# Patient Record
Sex: Female | Born: 1995 | Hispanic: Yes | Marital: Married | Smoking: Never smoker
Health system: Southern US, Community
[De-identification: ages and names within clinical notes are randomized; demographics above are authoritative.]

## PROBLEM LIST (undated history)

## (undated) ENCOUNTER — Inpatient Hospital Stay (HOSPITAL_COMMUNITY): Payer: Self-pay

## (undated) DIAGNOSIS — Z789 Other specified health status: Secondary | ICD-10-CM

## (undated) HISTORY — PX: NO PAST SURGERIES: SHX2092

---

## 2016-08-12 ENCOUNTER — Other Ambulatory Visit (HOSPITAL_COMMUNITY): Payer: Self-pay | Admitting: Nurse Practitioner

## 2016-08-12 DIAGNOSIS — Z369 Encounter for antenatal screening, unspecified: Secondary | ICD-10-CM

## 2016-08-12 LAB — OB RESULTS CONSOLE ANTIBODY SCREEN: Antibody Screen: NEGATIVE

## 2016-08-12 LAB — OB RESULTS CONSOLE RUBELLA ANTIBODY, IGM: Rubella: IMMUNE

## 2016-08-12 LAB — OB RESULTS CONSOLE ABO/RH: RH TYPE: POSITIVE

## 2016-08-12 LAB — OB RESULTS CONSOLE HEPATITIS B SURFACE ANTIGEN: Hepatitis B Surface Ag: NEGATIVE

## 2016-08-12 LAB — OB RESULTS CONSOLE HIV ANTIBODY (ROUTINE TESTING): HIV: NONREACTIVE

## 2016-08-12 LAB — OB RESULTS CONSOLE RPR: RPR: NONREACTIVE

## 2016-08-15 ENCOUNTER — Encounter (HOSPITAL_COMMUNITY): Payer: Self-pay | Admitting: *Deleted

## 2016-08-16 ENCOUNTER — Ambulatory Visit (HOSPITAL_COMMUNITY)
Admission: RE | Admit: 2016-08-16 | Discharge: 2016-08-16 | Disposition: A | Discharge status: Home / Self Care | Payer: Self-pay | Source: Ambulatory Visit | Attending: Nurse Practitioner | Admitting: Nurse Practitioner

## 2016-08-16 ENCOUNTER — Encounter (HOSPITAL_COMMUNITY): Payer: Self-pay

## 2016-08-16 ENCOUNTER — Other Ambulatory Visit (HOSPITAL_COMMUNITY): Payer: Self-pay | Admitting: Nurse Practitioner

## 2016-08-16 DIAGNOSIS — Z3682 Encounter for antenatal screening for nuchal translucency: Secondary | ICD-10-CM

## 2016-08-16 DIAGNOSIS — Z369 Encounter for antenatal screening, unspecified: Secondary | ICD-10-CM

## 2016-08-16 DIAGNOSIS — Z3A12 12 weeks gestation of pregnancy: Secondary | ICD-10-CM | POA: Insufficient documentation

## 2016-08-16 HISTORY — DX: Other specified health status: Z78.9

## 2016-08-23 ENCOUNTER — Other Ambulatory Visit (HOSPITAL_COMMUNITY): Payer: Self-pay

## 2016-09-09 NOTE — L&D Delivery Note (Addendum)
Patient is a 20y/o G1P1 @ 40 and 1 who presente din spontaneous labor. She progressed to complete and began pushing. She pushed for 2 hours with good progress. Pitocin was started to augment and pushined for another hour with little progress. Risk of vaccuum where discussed with patient and she agreed to proceed.   Operative Delivery Note At 12:40 PM a viable female was delivered via Vaginal, Vacuum Investment banker, operational(Extractor).  Presentation: vertex; Position: Occiput,, Anterior; Station: +2.  Verbal consent: obtained from patient. Offered interpreter services and patient declined. Risks and benefits discussed in detail.  Risks include, but are not limited to the risks of anesthesia, bleeding, infection, damage to maternal tissues, fetal cephalhematoma.  There is also the risk of inability to effect vaginal delivery of the head, or shoulder dystocia that cannot be resolved by established maneuvers, leading to the need for emergency cesarean section.  APGAR: 9, 9; weight pending  .   Placenta status:delivered intact with gentle tractions , .   Cord: 3 vessel with the following complications:nuchal x1 .  Cord pH: not sent  Anesthesia:  epidural Instruments: mighty vac with bell Episiotomy: None Lacerations: 3rd degree with >50% of sphincter involved. Suture Repair: rectal sheath was repaired with 2-0 moncryl with figure of eights. The remaineder of the vaginal repair done with 3-0 vicryl. 4-0 monocryl used to close the skin. Est. Blood Loss (mL):  300  Mom to postpartum.  Baby to Couplet care / Skin to Skin.  Ernestina Pennaicholas Schenk 02/25/2017, 1:27 PM

## 2016-11-07 ENCOUNTER — Inpatient Hospital Stay (HOSPITAL_COMMUNITY)
Admission: AD | Admit: 2016-11-07 | Discharge: 2016-11-07 | Disposition: A | Discharge status: Home / Self Care | Payer: Self-pay | Source: Ambulatory Visit | Attending: Obstetrics & Gynecology | Admitting: Obstetrics & Gynecology

## 2016-11-07 ENCOUNTER — Encounter (HOSPITAL_COMMUNITY): Payer: Self-pay | Admitting: *Deleted

## 2016-11-07 DIAGNOSIS — O98812 Other maternal infectious and parasitic diseases complicating pregnancy, second trimester: Secondary | ICD-10-CM | POA: Insufficient documentation

## 2016-11-07 DIAGNOSIS — B379 Candidiasis, unspecified: Secondary | ICD-10-CM

## 2016-11-07 DIAGNOSIS — O26892 Other specified pregnancy related conditions, second trimester: Secondary | ICD-10-CM | POA: Insufficient documentation

## 2016-11-07 DIAGNOSIS — N949 Unspecified condition associated with female genital organs and menstrual cycle: Secondary | ICD-10-CM

## 2016-11-07 DIAGNOSIS — B373 Candidiasis of vulva and vagina: Secondary | ICD-10-CM | POA: Insufficient documentation

## 2016-11-07 DIAGNOSIS — Z3A24 24 weeks gestation of pregnancy: Secondary | ICD-10-CM | POA: Insufficient documentation

## 2016-11-07 LAB — URINALYSIS, ROUTINE W REFLEX MICROSCOPIC
BILIRUBIN URINE: NEGATIVE
Glucose, UA: NEGATIVE mg/dL
HGB URINE DIPSTICK: NEGATIVE
KETONES UR: NEGATIVE mg/dL
NITRITE: NEGATIVE
Protein, ur: NEGATIVE mg/dL
SPECIFIC GRAVITY, URINE: 1.003 — AB (ref 1.005–1.030)
pH: 7 (ref 5.0–8.0)

## 2016-11-07 LAB — WET PREP, GENITAL
CLUE CELLS WET PREP: NONE SEEN
SPERM: NONE SEEN
Trich, Wet Prep: NONE SEEN

## 2016-11-07 MED ORDER — COMFORT FIT MATERNITY SUPP SM MISC: 1.0000 [IU] | Freq: Every day | 0 refills | Status: DC | PRN

## 2016-11-07 MED ORDER — TERCONAZOLE 0.4 % VA CREA: 1.0000 | TOPICAL_CREAM | Freq: Every day | VAGINAL | 0 refills | Status: DC

## 2016-11-07 NOTE — MAU Note (Addendum)
WITH INTERPRETER-    VIREA   PT SAYS   SHE  HAS LOWER ABD PAIN    AND  BACK PAIN  THAT COME/  GOES- STARTED    TODAY  AT  3 PM.    NO MEDS FOR PAIN .   [facility]- WITH [facility]-   DID NOT  CALL THEM.    NO DIFFICULTY VOIDING,     REG D/C.      LAST SEX-    YESTERDAY           SAYS     THIS  SAME  PAIN HAPPENED  1 WEEK AGO   BUT  WENT  AWAY

## 2016-11-07 NOTE — Discharge Instructions (Signed)
Candidiasis vaginal en los adultos (Gastrointestinal Yeast Infection, Adult) La candidiasis vaginal es una afeccin que causa dolor, hinchazn y enrojecimiento (inflamacin) de la vagina. Tambin causa secrecin vaginal. Esta es una enfermedad frecuente. Algunas mujeres contraen esta infeccin con frecuencia. CAUSAS La causa de la infeccin es un cambio en el equilibrio normal de los hongos (cndida) y las bacterias que viven en la vagina. Esta alteracin deriva en el crecimiento excesivo de los hongos, lo que causa la inflamacin. FACTORES DE RIESGO Es ms probable que esta afeccin se manifieste en:  Las mujeres que toman antibiticos.  Las mujeres que tienen diabetes.  Las mujeres que toman anticonceptivos.  Las mujeres que estn embarazadas.  Las mujeres que se hacen duchas vaginales con frecuencia.  Las mujeres que tienen un sistema de defensa (inmunitario) dbil.  Las mujeres que han tomado corticoides durante mucho tiempo.  Las mujeres que usan ropa ajustada con frecuencia. SNTOMAS Los sntomas de esta afeccin incluyen lo siguiente:  Secrecin vaginal blanca y espesa.  Hinchazn, picazn, enrojecimiento e irritacin de la vagina. Los labios de la vagina (vulva) tambin se pueden infectar.  Dolor o ardor al Geographical information systems officer.  Dolor durante las [employer]. DIAGNSTICO Esta afeccin se diagnostica mediante la historia clnica y un examen fsico. Este incluye un examen plvico. El mdico examinar una muestra de la secrecin vaginal con un microscopio. Probablemente el mdico enve esta muestra al laboratorio para analizarla y confirmar el diagnstico. TRATAMIENTO Esta afeccin se trata con medicamentos. Los [employer] pueden ser recetados o de [address]. Podrn indicarle que use uno o ms de lo siguiente:  Medicamentos por va oral.  Medicamentos que se aplican como una crema.  Medicamentos que se colocan directamente en la vagina (vulos vaginales). INSTRUCCIONES  PARA EL CUIDADO EN EL HOGAR  Tome o aplquese los medicamentos de venta libre y Building control surveyor como se lo haya indicado el mdico.  No tenga relaciones sexuales hasta que el mdico lo autorice. Comunique a su compaero sexual que tiene una infeccin por hongos. Esas personas deben consultar al mdico si tienen sntomas.  No use ropa ajustada, como pantis o pantalones ajustados.  Evite el uso de tampones hasta que el mdico lo autorice.  Consuma ms yogur. Esto puede ayudar a Artist de la candidiasis.  Intente darse un bao de asiento para [employer]. Se trata de un bao de agua tibia que se toma mientras se est sentado. El agua solo debe Adult nurse las caderas y cubrir las nalgas. Hgalo 3o 4veces al da o como se lo haya indicado el mdico.  No se haga duchas vaginales.  Use ropa interior transpirable de algodn.  Si tiene diabetes, mantenga bajo control los niveles de Banker. SOLICITE ATENCIN MDICA SI:  Lance Muss.  Los sntomas desaparecen y Stage manager.  Los sntomas no mejoran con Scientist, research (medical).  Los sntomas empeoran.  Aparecen nuevos sntomas.  Aparecen ampollas alrededor o adentro de la vagina.  Le sale sangre de la vagina y no est menstruando.  Siente dolor en el abdomen. Esta informacin no tiene Theme park manager el consejo del mdico. Asegrese de hacerle al mdico cualquier pregunta que tenga. Document Released: 06/05/2005 Document Revised: 12/18/2015 Document Reviewed: 02/27/2015 Elsevier Interactive Patient Education  2017 Elsevier Inc.  Round Ligament Pain during Pregnancy Many women will experience a type of pain referred to as round ligament pain during their pregnancy. This is associated with abdominal pain or discomfort. Since any type of abdominal pain during  pregnancy can be disconcerting, it is important to talk about round ligament pain to relieve any anxiety or fears you may have  regarding the symptoms you are feeling. Round ligament pain is due to normal changes that take place in the body during pregnancy. It is caused by stretching of the round ligaments attached to the uterus. More commonly it occurs on the right side of the pelvis. Round Ligament: An Overview Typically in the non-pregnant state the uterus is about the size of an apple or pear. There are thick ligaments which hold the uterus in place in the abdomen, referred to as round ligaments. During pregnancy, your uterus will expand in size and weight, and the ligaments supporting it will have to stretch, becoming longer and thinner. As these ligaments pull and tug they may irritate nearby nerve fibers, which causes pain. The severity of the pain in some cases can seem extreme. Some common symptoms of round ligament pain include:  Ligament spasms or contractions/cramps that trigger a sharp pain typically on the right side of the abdomen.  Pain upon waking or suddenly rolling over in your sleep.  Pain in the abdomen that is sharp brought on by exercise or other vigorous activity. Similar Problems Round ligament pain is often mistaken for other medical conditions because the symptoms are similar. Acute abdominal pain during pregnancy may also be a sign of other conditions including:  Abdominal cramps - Some abdominal pain is simply caused by change in bowel habits associated with pregnancy. Gas is a common problem that can cause sharp, shooting pain. You should always seek out medical care if your pain is accompanied by fever, chills, pain upon urination or if you have difficulty walking. Further exams and tests will be conducted to ensure that you do not have a more serious condition. It is not uncommon for women with lower abdominal pain to have a urinary tract infection, thus you may also be asked for a urine sample. Treatment If all other conditions are ruled out you can treat your round ligament  pain relatively easily. You may be advised to take some acetaminophen (Tylenol) to reduce the severity of any persistent pain and asked to reduce your activity level. You can apply a heating pad to the area of pain or take a warm bath. Lying on the opposite side of the pain may help as well. Most women will find relief from round ligament pain simply by altering their daily routines slightly. The good news is round ligament pain will disappear completely once you have given birth to your child!

## 2016-11-07 NOTE — MAU Provider Note (Signed)
History     CSN: [id number]  Arrival date and time: 11/07/16 1754   First Provider Initiated Contact with Patient 11/07/16 2015      Chief Complaint  Patient presents with  . Abdominal Pain   Crystal Huff is a 21 y.o. G1P0 at [id number] who presents today with abdominal pain. She states that off and on for about one week she has had bilateral lower abdominal pain that wraps to her back. She states that the pain is worse with some movement. She denies any VB or LOF. She reports regular fetal movement. She denies any vaginal discharge.    Abdominal Pain  This is a new problem. The current episode started yesterday. The onset quality is gradual. The problem occurs constantly. The problem has been unchanged. Pain location: along the sides of the abdomen and radiating to the back.  The pain is at a severity of 6/10. The quality of the pain is sharp. The abdominal pain radiates to the back. Pertinent negatives include no constipation, diarrhea, dysuria, fever, nausea or vomiting. Nothing aggravates the pain. The pain is relieved by nothing. She has tried nothing for the symptoms.   Past Medical History:  Diagnosis Date  . Medical history non-contributory     Past Surgical History:  Procedure Laterality Date  . NO PAST SURGERIES      History reviewed. No pertinent family history.  Social History  Substance Use Topics  . Smoking status: Never Smoker  . Smokeless tobacco: Never Used  . Alcohol use No    Allergies: No Known Allergies  Prescriptions Prior to Admission  Medication Sig Dispense Refill Last Dose  . Prenatal Vit-Fe Fumarate-FA (PRENATAL VITAMIN PO) Take by mouth.   Taking    Review of Systems  Constitutional: Negative for chills and fever.  Gastrointestinal: Positive for abdominal pain. Negative for constipation, diarrhea, nausea and vomiting.  Genitourinary: Negative for dysuria, urgency, vaginal bleeding and vaginal discharge.   Physical Exam   Blood  pressure 102/57, pulse 70, temperature 98.5 F (36.9 C), temperature source Oral, resp. rate 18, height 5\' 1"  (1.549 m), weight 150 lb 12 oz (68.4 kg), last menstrual period 05/20/2016.  Physical Exam  Nursing note and vitals reviewed. Constitutional: She is oriented to person, place, and time. She appears well-developed and well-nourished. No distress.  HENT:  Head: Normocephalic.  Cardiovascular: Normal rate.   Respiratory: Effort normal.  GI: Soft. There is no tenderness. There is no rebound.  Genitourinary:  Genitourinary Comments:  Cervix: closed/thick/ballotable.   Neurological: She is alert and oriented to person, place, and time.  Skin: Skin is warm and dry.  Psychiatric: She has a normal mood and affect.   Results for orders placed or performed during the hospital encounter of 11/07/16 (from the past 24 hour(s))  Urinalysis, Routine w reflex microscopic     Status: Abnormal   Collection Time: 11/07/16  6:34 PM  Result Value Ref Range   Color, Urine STRAW (A) YELLOW   APPearance CLEAR CLEAR   Specific Gravity, Urine 1.003 (L) 1.005 - 1.030   pH 7.0 5.0 - 8.0   Glucose, UA NEGATIVE NEGATIVE mg/dL   Hgb urine dipstick NEGATIVE NEGATIVE   Bilirubin Urine NEGATIVE NEGATIVE   Ketones, ur NEGATIVE NEGATIVE mg/dL   Protein, ur NEGATIVE NEGATIVE mg/dL   Nitrite NEGATIVE NEGATIVE   Leukocytes, UA MODERATE (A) NEGATIVE   RBC / HPF 0-5 0 - 5 RBC/hpf   WBC, UA 0-5 0 - 5 WBC/hpf   Bacteria,  UA MANY (A) NONE SEEN   Squamous Epithelial / LPF 0-5 (A) NONE SEEN  Wet prep, genital     Status: Abnormal   Collection Time: 11/07/16  8:20 PM  Result Value Ref Range   Yeast Wet Prep HPF POC PRESENT (A) NONE SEEN   Trich, Wet Prep NONE SEEN NONE SEEN   Clue Cells Wet Prep HPF POC NONE SEEN NONE SEEN   WBC, Wet Prep HPF POC FEW (A) NONE SEEN   Sperm NONE SEEN    FHT: 135, moderate with 10x10 accels, random variable, appropriate for GA Toco: no UCs  MAU Course   Procedures  MDM   Assessment and Plan   1. Round ligament pain   2. [redacted] weeks gestation of pregnancy   3. Yeast infection    DC home Comfort measures reviewed  2nd/3rd Trimester precautions  PTL precautions  Fetal kick counts RX: terazol 7 as directed, maternity support belt  Return to MAU as needed FU with OB as planned  Follow-up Information    [facility] Follow up.   Contact information: [address] [place] [address] [phone]            Tawnya Crook 11/07/2016, 8:18 PM

## 2016-11-08 LAB — GC/CHLAMYDIA PROBE AMP (~~LOC~~) NOT AT ARMC
CHLAMYDIA, DNA PROBE: NEGATIVE
NEISSERIA GONORRHEA: NEGATIVE

## 2016-12-16 ENCOUNTER — Ambulatory Visit
Admission: RE | Admit: 2016-12-16 | Discharge: 2016-12-16 | Disposition: A | Discharge status: Home / Self Care | Payer: No Typology Code available for payment source | Source: Ambulatory Visit | Attending: Infectious Disease | Admitting: Infectious Disease

## 2016-12-16 ENCOUNTER — Other Ambulatory Visit: Payer: Self-pay | Admitting: Infectious Disease

## 2016-12-16 DIAGNOSIS — R7611 Nonspecific reaction to tuberculin skin test without active tuberculosis: Secondary | ICD-10-CM

## 2017-01-27 LAB — OB RESULTS CONSOLE GC/CHLAMYDIA
Chlamydia: NEGATIVE
Gonorrhea: NEGATIVE

## 2017-01-27 LAB — OB RESULTS CONSOLE GBS: STREP GROUP B AG: NEGATIVE

## 2017-02-24 ENCOUNTER — Encounter (HOSPITAL_COMMUNITY): Payer: Self-pay

## 2017-02-24 ENCOUNTER — Inpatient Hospital Stay (HOSPITAL_COMMUNITY)
Admission: AD | Admit: 2017-02-24 | Discharge: 2017-02-27 | DRG: 775 | Disposition: A | Discharge status: Home / Self Care | Payer: Medicaid Other | Source: Ambulatory Visit | Attending: Family Medicine | Admitting: Family Medicine

## 2017-02-24 DIAGNOSIS — Z3493 Encounter for supervision of normal pregnancy, unspecified, third trimester: Secondary | ICD-10-CM | POA: Diagnosis present

## 2017-02-24 DIAGNOSIS — Z91040 Latex allergy status: Secondary | ICD-10-CM | POA: Diagnosis not present

## 2017-02-24 DIAGNOSIS — Z3A40 40 weeks gestation of pregnancy: Secondary | ICD-10-CM | POA: Diagnosis not present

## 2017-02-24 LAB — CBC
HCT: 37.7 % (ref 36.0–46.0)
HEMOGLOBIN: 12.2 g/dL (ref 12.0–15.0)
MCH: 29.8 pg (ref 26.0–34.0)
MCHC: 32.4 g/dL (ref 30.0–36.0)
MCV: 92.2 fL (ref 78.0–100.0)
Platelets: 211 10*3/uL (ref 150–400)
RBC: 4.09 MIL/uL (ref 3.87–5.11)
RDW: 15.1 % (ref 11.5–15.5)
WBC: 13.3 10*3/uL — AB (ref 4.0–10.5)

## 2017-02-24 LAB — TYPE AND SCREEN
ABO/RH(D): A POS
Antibody Screen: NEGATIVE

## 2017-02-24 MED ORDER — LACTATED RINGERS IV SOLN
INTRAVENOUS | Status: DC
  Administered 2017-02-24 – 2017-02-25 (×2): via INTRAVENOUS

## 2017-02-24 MED ORDER — LACTATED RINGERS IV SOLN
500.0000 mL | INTRAVENOUS | Status: DC | PRN
  Administered 2017-02-25: 500 mL via INTRAVENOUS
  Administered 2017-02-25 (×2): 250 mL via INTRAVENOUS

## 2017-02-24 MED ORDER — ONDANSETRON HCL 4 MG/2ML IJ SOLN: 4.0000 mg | Freq: Four times a day (QID) | INTRAMUSCULAR | Status: DC | PRN

## 2017-02-24 MED ORDER — LIDOCAINE HCL (PF) 1 % IJ SOLN
30.0000 mL | INTRAMUSCULAR | Status: DC | PRN
  Filled 2017-02-24: qty 30

## 2017-02-24 MED ORDER — OXYTOCIN 40 UNITS IN LACTATED RINGERS INFUSION - SIMPLE MED
2.5000 [IU]/h | INTRAVENOUS | Status: DC
  Filled 2017-02-24: qty 1000

## 2017-02-24 MED ORDER — FLEET ENEMA 7-19 GM/118ML RE ENEM: 1.0000 | ENEMA | RECTAL | Status: DC | PRN

## 2017-02-24 MED ORDER — OXYCODONE-ACETAMINOPHEN 5-325 MG PO TABS: 2.0000 | ORAL_TABLET | ORAL | Status: DC | PRN

## 2017-02-24 MED ORDER — ACETAMINOPHEN 325 MG PO TABS
650.0000 mg | ORAL_TABLET | ORAL | Status: DC | PRN
  Filled 2017-02-24: qty 2

## 2017-02-24 MED ORDER — SOD CITRATE-CITRIC ACID 500-334 MG/5ML PO SOLN
30.0000 mL | ORAL | Status: DC | PRN
  Filled 2017-02-24: qty 15

## 2017-02-24 MED ORDER — OXYTOCIN BOLUS FROM INFUSION
500.0000 mL | Freq: Once | INTRAVENOUS | Status: AC
  Administered 2017-02-25: 500 mL via INTRAVENOUS

## 2017-02-24 MED ORDER — FENTANYL CITRATE (PF) 100 MCG/2ML IJ SOLN
100.0000 ug | INTRAMUSCULAR | Status: DC | PRN
Start: 2017-02-24 — End: 2017-02-27

## 2017-02-24 MED ORDER — OXYCODONE-ACETAMINOPHEN 5-325 MG PO TABS: 1.0000 | ORAL_TABLET | ORAL | Status: DC | PRN

## 2017-02-24 NOTE — H&P (Signed)
LABOR AND DELIVERY ADMISSION HISTORY AND PHYSICAL NOTE  Latangela Mccomas is a 21 y.o. female G1P0 with IUP at [redacted]w[redacted]d presenting for SOL.   She reports positive fetal movement. She denies leakage of fluid or vaginal bleeding.  Prenatal History/Complications:  NONE  Past Medical History: Past Medical History:  Diagnosis Date  . Medical history non-contributory     Past Surgical History: Past Surgical History:  Procedure Laterality Date  . NO PAST SURGERIES      Obstetrical History: OB History    Gravida Para Term Preterm AB Living   1             SAB TAB Ectopic Multiple Live Births                  Social History: Social History   Social History  . Marital status: Married    Spouse name: N/A  . Number of children: N/A  . Years of education: N/A   Social History Main Topics  . Smoking status: Never Smoker  . Smokeless tobacco: Never Used  . Alcohol use No  . Drug use: No  . Sexual activity: Yes   Other Topics Concern  . None   Social History Narrative  . None    Family History: History reviewed. No pertinent family history.  Allergies: Allergies  Allergen Reactions  . Latex Other (See Comments)    Causes irritation.    Prescriptions Prior to Admission  Medication Sig Dispense Refill Last Dose  . Elastic Bandages & Supports (COMFORT FIT MATERNITY SUPP SM) MISC 1 Units by Does not apply route daily as needed. 1 each 0   . Prenatal Vit-Fe Fumarate-FA (PRENATAL MULTIVITAMIN) TABS tablet Take 1 tablet by mouth daily at 12 noon.   11/07/2016 at Unknown time  . terconazole (TERAZOL 7) 0.4 % vaginal cream Place 1 applicator vaginally at bedtime. For 7 nights 45 g 0      Review of Systems   All systems reviewed and negative except as stated in HPI  Physical Exam Blood pressure 117/73, pulse 78, temperature 98.1 F (36.7 C), temperature source Oral, resp. rate 18, height 5\' 2"  (1.575 m), weight 203 lb (92.1 kg), last menstrual period 05/20/2016,  SpO2 100 %. General appearance: alert, cooperative, appears stated age and no distress Lungs: clear to auscultation bilaterally Heart: regular rate and rhythm Abdomen: soft, non-tender; bowel sounds normal Extremities: No calf swelling or tenderness Presentation: cephalic Fetal monitoring: 135 bpm, mod var, +accels, no decels Uterine activity: q4-78min Dilation: 4.5 Effacement (%): 90 Station: -2 Exam by:: Risk analyst, RN   Prenatal labs: ABO, Rh:   A POS Antibody:  NEG Rubella: !Error! IMMUNE RPR:    NON-REACTIVE HBsAg:    NON-REACTIVE HIV:   NON-REACTIVE GBS:    NEG 1 hr Glucola: 75 - normal Genetic screening:  declined Anatomy US: Normal  Prenatal Transfer Tool  Maternal Diabetes: No Genetic Screening: Declined Maternal Ultrasounds/Referrals: Normal Fetal Ultrasounds or other Referrals:  None Maternal Substance Abuse:  No Significant Maternal Medications:  None Significant Maternal Lab Results: Lab values include: Group B Strep negative, Other: +PPD  No results found for this or any previous visit (from the past 24 hour(s)).  There are no active problems to display for this patient.   Assessment: Karalina Tift is a 21 y.o. G1P0 at [redacted]w[redacted]d here for SOL  #Labor:SOL, augment as needed #Pain: IV pain meds for now, considering epidural #FWB: Cat I #ID:  GBS Neg;  +PPD early in  pregnancy (CXR was normal, follow up with health department postpartum) #MOF: Breast #MOC:Depo #Circ:  Declines  Jen MowElizabeth Mumaw, DO OB Fellow [facility] 02/24/2017, 8:35 PM

## 2017-02-24 NOTE — MAU Note (Signed)
Urine in lab 

## 2017-02-24 NOTE — MAU Note (Signed)
Pt. Removed from fetal monitors - per Dr. Omer JackMumaw - OK for patient to ambulate hallway for one hour, recheck SVE and call with results - to determine admission or discharge.  Pt. Has family members at the bedside to translate - pt. Permission received.

## 2017-02-24 NOTE — MAU Note (Signed)
Pt reports contractions since 8:30 am, now 5 minutes apart.

## 2017-02-24 NOTE — Progress Notes (Signed)
FHR reactive.  EFM discontinued for pt to ambulate x1hr.  Pt instructed to return to room if LOF, gush of fluid, sudden sharp pain, or vaginal bleeding.  Pt & family verb understanding.

## 2017-02-24 NOTE — Progress Notes (Signed)
Report given to Dr Omer JackMumaw via phone re: pt arrival, reactive FHR, UCs, & change in SVE after ambulating. Orders rec'd to allow pt to ambulate more & recheck cervix.

## 2017-02-25 ENCOUNTER — Encounter (HOSPITAL_COMMUNITY): Payer: Self-pay | Admitting: Anesthesiology

## 2017-02-25 ENCOUNTER — Inpatient Hospital Stay (HOSPITAL_COMMUNITY): Payer: Medicaid Other | Admitting: Anesthesiology

## 2017-02-25 DIAGNOSIS — Z3A40 40 weeks gestation of pregnancy: Secondary | ICD-10-CM

## 2017-02-25 LAB — ABO/RH: ABO/RH(D): A POS

## 2017-02-25 LAB — RPR: RPR: NONREACTIVE

## 2017-02-25 MED ORDER — PHENYLEPHRINE 40 MCG/ML (10ML) SYRINGE FOR IV PUSH (FOR BLOOD PRESSURE SUPPORT)
80.0000 ug | PREFILLED_SYRINGE | INTRAVENOUS | Status: DC | PRN
  Filled 2017-02-25: qty 5

## 2017-02-25 MED ORDER — PRENATAL MULTIVITAMIN CH
1.0000 | ORAL_TABLET | Freq: Every day | ORAL | Status: DC
  Administered 2017-02-26: 1 via ORAL
  Filled 2017-02-25: qty 1

## 2017-02-25 MED ORDER — ACETAMINOPHEN 325 MG PO TABS
650.0000 mg | ORAL_TABLET | ORAL | Status: DC | PRN
  Administered 2017-02-25 – 2017-02-26 (×3): 650 mg via ORAL
  Filled 2017-02-25 (×2): qty 2

## 2017-02-25 MED ORDER — PHENYLEPHRINE 40 MCG/ML (10ML) SYRINGE FOR IV PUSH (FOR BLOOD PRESSURE SUPPORT)
80.0000 ug | PREFILLED_SYRINGE | INTRAVENOUS | Status: DC | PRN
  Filled 2017-02-25: qty 10
  Filled 2017-02-25: qty 5

## 2017-02-25 MED ORDER — TETANUS-DIPHTH-ACELL PERTUSSIS 5-2.5-18.5 LF-MCG/0.5 IM SUSP
0.5000 mL | Freq: Once | INTRAMUSCULAR | Status: DC
  Filled 2017-02-25: qty 0.5

## 2017-02-25 MED ORDER — FENTANYL 2.5 MCG/ML BUPIVACAINE 1/10 % EPIDURAL INFUSION (WH - ANES)
14.0000 mL/h | INTRAMUSCULAR | Status: DC | PRN
  Administered 2017-02-25: 14 mL/h via EPIDURAL
  Administered 2017-02-25: 12 mL/h via EPIDURAL
  Filled 2017-02-25 (×2): qty 100

## 2017-02-25 MED ORDER — OXYCODONE HCL 5 MG PO TABS: 10.0000 mg | ORAL_TABLET | ORAL | Status: DC | PRN

## 2017-02-25 MED ORDER — OXYTOCIN 40 UNITS IN LACTATED RINGERS INFUSION - SIMPLE MED
1.0000 m[IU]/min | INTRAVENOUS | Status: DC
Start: 2017-02-25 — End: 2017-02-25
  Administered 2017-02-25: 2 m[IU]/min via INTRAVENOUS

## 2017-02-25 MED ORDER — DIBUCAINE 1 % RE OINT: 1.0000 "application " | TOPICAL_OINTMENT | RECTAL | Status: DC | PRN

## 2017-02-25 MED ORDER — TERBUTALINE SULFATE 1 MG/ML IJ SOLN
0.2500 mg | Freq: Once | INTRAMUSCULAR | Status: DC | PRN
Start: 2017-02-25 — End: 2017-02-25
  Filled 2017-02-25: qty 1

## 2017-02-25 MED ORDER — DIPHENHYDRAMINE HCL 50 MG/ML IJ SOLN: 12.5000 mg | INTRAMUSCULAR | Status: DC | PRN

## 2017-02-25 MED ORDER — COCONUT OIL OIL: 1.0000 "application " | TOPICAL_OIL | Status: DC | PRN

## 2017-02-25 MED ORDER — IBUPROFEN 600 MG PO TABS
600.0000 mg | ORAL_TABLET | Freq: Four times a day (QID) | ORAL | Status: DC
  Administered 2017-02-25 – 2017-02-27 (×7): 600 mg via ORAL
  Filled 2017-02-25 (×7): qty 1

## 2017-02-25 MED ORDER — LIDOCAINE HCL (PF) 1 % IJ SOLN
INTRAMUSCULAR | Status: DC | PRN
  Administered 2017-02-25 (×2): 4 mL via EPIDURAL

## 2017-02-25 MED ORDER — ONDANSETRON HCL 4 MG PO TABS: 4.0000 mg | ORAL_TABLET | ORAL | Status: DC | PRN

## 2017-02-25 MED ORDER — EPHEDRINE 5 MG/ML INJ
10.0000 mg | INTRAVENOUS | Status: DC | PRN
  Filled 2017-02-25: qty 2

## 2017-02-25 MED ORDER — ONDANSETRON HCL 4 MG/2ML IJ SOLN: 4.0000 mg | INTRAMUSCULAR | Status: DC | PRN

## 2017-02-25 MED ORDER — LACTATED RINGERS IV SOLN: 500.0000 mL | Freq: Once | INTRAVENOUS | Status: DC

## 2017-02-25 MED ORDER — DIPHENHYDRAMINE HCL 25 MG PO CAPS: 25.0000 mg | ORAL_CAPSULE | Freq: Four times a day (QID) | ORAL | Status: DC | PRN

## 2017-02-25 MED ORDER — WITCH HAZEL-GLYCERIN EX PADS: 1.0000 "application " | MEDICATED_PAD | CUTANEOUS | Status: DC | PRN

## 2017-02-25 MED ORDER — ZOLPIDEM TARTRATE 5 MG PO TABS: 5.0000 mg | ORAL_TABLET | Freq: Every evening | ORAL | Status: DC | PRN

## 2017-02-25 MED ORDER — BENZOCAINE-MENTHOL 20-0.5 % EX AERO
1.0000 "application " | INHALATION_SPRAY | CUTANEOUS | Status: DC | PRN
  Administered 2017-02-25: 1 via TOPICAL
  Filled 2017-02-25: qty 56

## 2017-02-25 MED ORDER — POLYETHYLENE GLYCOL 3350 17 G PO PACK
17.0000 g | PACK | Freq: Every day | ORAL | Status: DC
  Administered 2017-02-25 – 2017-02-26 (×2): 17 g via ORAL
  Filled 2017-02-25 (×4): qty 1

## 2017-02-25 MED ORDER — SENNOSIDES-DOCUSATE SODIUM 8.6-50 MG PO TABS
2.0000 | ORAL_TABLET | ORAL | Status: DC
  Administered 2017-02-26: 2 via ORAL
  Filled 2017-02-25 (×2): qty 2

## 2017-02-25 MED ORDER — SIMETHICONE 80 MG PO CHEW: 80.0000 mg | CHEWABLE_TABLET | ORAL | Status: DC | PRN

## 2017-02-25 MED ORDER — OXYCODONE HCL 5 MG PO TABS
5.0000 mg | ORAL_TABLET | ORAL | Status: DC | PRN
  Administered 2017-02-25: 5 mg via ORAL
  Filled 2017-02-25: qty 1

## 2017-02-25 NOTE — Lactation Note (Signed)
This note was copied from a baby's chart. Lactation Consultation Note P1 mom with infant STS in L and D.  Mom is Spanish speaking but has family interpreting.  Family member meant to interpret is outside the room; [facility] family if she could call interpreter.  Sister of mom requests I wait for them to call the family member that has been interpreting for mom; mat grandmother.  When grandmother arrived, LC suggested mom reposition infant from cradle hold to a football hold.  Mom agrees and LC helps mom position with pillows and blankets and positions infant into different hold.    LC uses reverse pressure to soften areola area.  Hand expression taught but mom did not return demonstration [facility].  Gtts, of colostrum seen on nipple after easily being expressed.  Tissue was taunt but became more compressible after reverse pressure was performed, which helped infant to latch easier after several attempts. Once infant latched, lips were flanged and gape was wide.  Good rhythmic jaw movement noted with breast compression and massage.    BF basics reviewed with family. LC taught patient to feed 8-12 times in 24 hours.  Discussed importance of watching for feeding cues and importance of STS.  Family denies further questions at this time.  BF Spanish lactation brochure shared with family along with resource sheet.  Family encouraged to call out for assistance or questions regarding bf.      Patient Name: Crystal Huff Today's Date: 02/25/2017 Reason for consult: Initial assessment   Maternal Data Formula Feeding for Exclusion: No Has patient been taught Hand Expression?: Yes (pt. did not return demonstration) Does the patient have breastfeeding experience prior to this delivery?: No  Feeding Feeding Type: Breast Fed Length of feed: 15 min  LATCH Score/Interventions Latch: Repeated attempts needed to sustain latch, nipple held in mouth throughout feeding, stimulation  needed to elicit sucking reflex. Intervention(s): Adjust position;Assist with latch;Breast massage;Breast compression (reverse pressure)  Audible Swallowing: A few with stimulation Intervention(s): Hand expression;Skin to skin  Type of Nipple: Everted at rest and after stimulation (taunt tissue)  Comfort (Breast/Nipple): Soft / non-tender     Hold (Positioning): Assistance needed to correctly position infant at breast and maintain latch. Intervention(s): Skin to skin;Position options;Support Pillows;Breastfeeding basics reviewed  LATCH Score: 7  Lactation Tools Discussed/Used Tools: Other (comment) (reverse pressure used to soften areola tissue)   Consult Status Consult Status: Follow-up Date: 02/26/17 Follow-up type: In-patient    Crystal Huff 02/25/2017, 4:01 PM

## 2017-02-25 NOTE — Anesthesia Postprocedure Evaluation (Signed)
Anesthesia Post Note  Patient: Crystal FinnAna Valencia Huff  Procedure(s) Performed: * No procedures listed *     Patient location during evaluation: Mother Baby Anesthesia Type: Epidural Level of consciousness: awake and alert Pain management: pain level controlled Vital Signs Assessment: post-procedure vital signs reviewed and stable Respiratory status: spontaneous breathing, nonlabored ventilation and respiratory function stable Cardiovascular status: stable Postop Assessment: no headache, no backache and epidural receding Anesthetic complications: no    Last Vitals:  Vitals:   02/25/17 1415 02/25/17 1440  BP: 104/63 113/63  Pulse: 99 95  Resp: 18 18  Temp: 37.1 C 37.2 C    Last Pain:  Vitals:   02/25/17 1440  TempSrc: Oral  PainSc: 0-No pain   Pain Goal:                 Verina Galeno

## 2017-02-25 NOTE — Anesthesia Preprocedure Evaluation (Signed)
Anesthesia Evaluation  Patient identified by MRN, date of birth, ID band Patient awake    Reviewed: Allergy & Precautions, Patient's Chart, lab work & pertinent test results  Airway Mallampati: III  TM Distance: >3 FB Neck ROM: Full    Dental no notable dental hx. (+) Teeth Intact   Pulmonary neg pulmonary ROS,    Pulmonary exam normal breath sounds clear to auscultation       Cardiovascular Normal cardiovascular exam Rhythm:Regular Rate:Normal     Neuro/Psych negative neurological ROS  negative psych ROS   GI/Hepatic Neg liver ROS, GERD  ,  Endo/Other  Obesity  Renal/GU negative Renal ROS  negative genitourinary   Musculoskeletal negative musculoskeletal ROS (+)   Abdominal (+) + obese,   Peds  Hematology negative hematology ROS (+)   Anesthesia Other Findings   Reproductive/Obstetrics (+) Pregnancy                             Anesthesia Physical Anesthesia Plan  ASA: II  Anesthesia Plan: Epidural   Post-op Pain Management:    Induction:   PONV Risk Score and Plan:   Airway Management Planned: Natural Airway  Additional Equipment:   Intra-op Plan:   Post-operative Plan:   Informed Consent: I have reviewed the patients History and Physical, chart, labs and discussed the procedure including the risks, benefits and alternatives for the proposed anesthesia with the patient or authorized representative who has indicated his/her understanding and acceptance.     Plan Discussed with: Anesthesiologist  Anesthesia Plan Comments:         Anesthesia Quick Evaluation

## 2017-02-25 NOTE — Anesthesia Procedure Notes (Signed)
Epidural Patient location during procedure: OB Start time: 02/25/2017 1:24 AM  Staffing Anesthesiologist: Mal AmabileFOSTER, Chapel Silverthorn Performed: anesthesiologist   Preanesthetic Checklist Completed: patient identified, site marked, surgical consent, pre-op evaluation, timeout performed, IV checked, risks and benefits discussed and monitors and equipment checked  Epidural Patient position: sitting Prep: site prepped and draped and DuraPrep Patient monitoring: continuous pulse ox and blood pressure Approach: midline Location: L3-L4 Injection technique: LOR air  Needle:  Needle type: Tuohy  Needle gauge: 17 G Needle length: 9 cm and 9 Needle insertion depth: 6 cm Catheter type: closed end flexible Catheter size: 19 Gauge Catheter at skin depth: 11 cm Test dose: negative and Other  Assessment Events: blood not aspirated, injection not painful, no injection resistance, negative IV test and no paresthesia  Additional Notes Patient identified. Risks and benefits discussed including failed block, incomplete  Pain control, post dural puncture headache, nerve damage, paralysis, blood pressure Changes, nausea, vomiting, reactions to medications-both toxic and allergic and post Partum back pain. All questions were answered. Patient expressed understanding and wished to proceed. Sterile technique was used throughout procedure. Epidural site was Dressed with sterile barrier dressing. No paresthesias, signs of intravascular injection Or signs of intrathecal spread were encountered.  Patient was more comfortable after the epidural was dosed. Please see RN's note for documentation of vital signs and FHR which are stable.

## 2017-02-25 NOTE — Anesthesia Pain Management Evaluation Note (Signed)
  CRNA Pain Management Visit Note  Patient: Crystal Huff, 21 y.o., female  "Hello I am a member of the anesthesia team at [facility]. We have an anesthesia team available at all times to provide care throughout the hospital, including epidural management and anesthesia for C-section. I don't know your plan for the delivery whether it a natural birth, water birth, IV sedation, nitrous supplementation, doula or epidural, but we want to meet your pain goals."   1.Was your pain managed to your expectations on prior hospitalizations?   No prior hospitalizations  2.What is your expectation for pain management during this hospitalization?     Epidural  3.How can we help you reach that goal? Epidural in place and working well.  Record the patient's initial score and the patient's pain goal.   Pain: 0  Pain Goal: 4 [facility] wants you to be able to say your pain was always managed very well.  Tashauna Caisse 02/25/2017

## 2017-02-25 NOTE — Progress Notes (Signed)
Patient ID: Iven FinnAna Valencia Marqueli, female   DOB: Mar 03, 1996, 21 y.o.   MRN: [id number]  S: Patient seen & examined for progress of labor. Patient feeling contractions, unsure of wanting to do epidural.    O:  Vitals:   02/24/17 1908 02/24/17 2111 02/24/17 2352 02/25/17 0040  BP: 117/73 108/76  (!) 138/58  Pulse: 78 85  94  Resp: 18 20  20   Temp: 98.1 F (36.7 C) 98.2 F (36.8 C) 98.5 F (36.9 C)   TempSrc: Oral Oral Oral   SpO2:      Weight:  201 lb (91.2 kg)    Height:  5\' 2"  (1.575 m)      Dilation: 6.5 Effacement (%): 90 Cervical Position: Middle Station: -1 Presentation: Vertex Exam by:: Laural RoesB. Parks, RN  SROM performed, light meconium stained fluid with blood tinge.   FHT: 135bpm, mod var, +accels, no decels TOCO: q2-204min   A/P: SROM - light mec Continue expectant management Anticipate SVD

## 2017-02-25 NOTE — Lactation Note (Signed)
This note was copied from a baby's chart. Lactation Consultation Note  Patient Name: Boy Iven Finnna Valencia Marqueli Today's Date: 02/25/2017  Follow up visit at 8 hours of age.  FOB reports they did call for assist earlier, but then baby had spit up.  Baby is asleep now and mom denies concerns at this time.     Maternal Data    Feeding Feeding Type: Breast Fed  LATCH Score/Interventions Latch: Grasps breast easily, tongue down, lips flanged, rhythmical sucking. Intervention(s): Breast massage;Breast compression;Assist with latch  Audible Swallowing: Spontaneous and intermittent Intervention(s): Skin to skin;Hand expression;Alternate breast massage  Type of Nipple: Everted at rest and after stimulation  Comfort (Breast/Nipple): Soft / non-tender     Hold (Positioning): No assistance needed to correctly position infant at breast. Intervention(s): Skin to skin;Position options;Support Pillows;Breastfeeding basics reviewed  LATCH Score: 10  Lactation Tools Discussed/Used     Consult Status      Sereen Schaff, Arvella MerlesJana Lynn 02/25/2017, 9:07 PM

## 2017-02-25 NOTE — Plan of Care (Signed)
Problem: Education: Goal: Knowledge of condition will improve Admission education, safety, and unit protocols reviewed with patient and significant other. Ipad interpreter Learta Coddingaysha [phone] used for interpretation.

## 2017-02-25 NOTE — Progress Notes (Signed)
Patient seen and doing well. Feeling pressure intermittently. No other complaints. Plan to continue laboring down at this time. Expect SVD.   Crystal Huff Tirsa Gail, MD 02/25/17 9:32 AM

## 2017-02-26 ENCOUNTER — Encounter (HOSPITAL_COMMUNITY): Payer: Self-pay

## 2017-02-26 LAB — CBC
HEMATOCRIT: 29.1 % — AB (ref 36.0–46.0)
HEMOGLOBIN: 9.6 g/dL — AB (ref 12.0–15.0)
MCH: 30.3 pg (ref 26.0–34.0)
MCHC: 33 g/dL (ref 30.0–36.0)
MCV: 91.8 fL (ref 78.0–100.0)
Platelets: 173 10*3/uL (ref 150–400)
RBC: 3.17 MIL/uL — ABNORMAL LOW (ref 3.87–5.11)
RDW: 15.5 % (ref 11.5–15.5)
WBC: 19.8 10*3/uL — ABNORMAL HIGH (ref 4.0–10.5)

## 2017-02-26 MED ORDER — TRAMADOL HCL 50 MG PO TABS
100.0000 mg | ORAL_TABLET | Freq: Four times a day (QID) | ORAL | Status: DC | PRN
  Administered 2017-02-26: 100 mg via ORAL
  Filled 2017-02-26: qty 2

## 2017-02-26 NOTE — Progress Notes (Signed)
POSTPARTUM PROGRESS NOTE  Post Partum Day #1  Subjective:  Crystal Huff is a 21 y.o. G1P1001 5650w1d s/p VAVD.  No acute events overnight.  Pt denies problems with ambulating, voiding or po intake.  She denies nausea or vomiting.  Pain is moderately controlled.  She has had flatus. She has not had bowel movement.  Lochia Minimal. She has had dizziness with oxycodone yesterday.  Objective: Blood pressure (!) 101/47, pulse 82, temperature 98.7 F (37.1 C), temperature source Oral, resp. rate 18, height 5\' 2"  (1.575 m), weight 91.2 kg (201 lb), last menstrual period 05/20/2016, SpO2 98 %, unknown if currently breastfeeding.  Physical Exam:  General: alert, cooperative and no distress GU: wound clean and dry Lochia:normal flow Uterine Fundus: firm DVT Evaluation: No calf swelling or tenderness Extremities: no LE edema   Recent Labs  02/24/17 2045 02/26/17 0557  HGB 12.2 9.6*  HCT 37.7 29.1*    Assessment/Plan:  ASSESSMENT: Crystal Huff is a 21 y.o. G1P1001 3850w1d s/p VAVD with 3rd degree perineal tear. Dizziness with oxycodone. Pain relatively well controlled with ibuprofen and tylenol. Will DC oxy and use tramadol for breakthrough pain today. Continue stool softener.  Plan for discharge tomorrow   LOS: 2 days   Howard PouchLauren Feng, MD PGY-1 [facility]  02/26/2017, 7:36 AM    OB FELLOW POSTPARTUM PROGRESS NOTE ATTESTATION  I have seen and examined this patient and agree with above documentation in the resident's note.   Jen MowElizabeth Sameul Tagle, DO OB Fellow 10:17 AM

## 2017-02-26 NOTE — Lactation Note (Signed)
This note was copied from a baby's chart. Lactation Consultation Note  Patient Name: Crystal Huff HYQMV'HToday's Date: 02/26/2017 Reason for consult: Follow-up assessment;Breast/nipple pain;Hyperbilirubinemia  Visited with Mom, baby 1826 hrs old.  Stools 4 and voids 1 last 24 hrs.  Baby getting serum bilirubin drawn.  He started cueing, so offered to assist with positioning and latch.  Undressed baby to facilitate STS at the breast. Mom's sister, and niece present to assist with language barrier.   Baby latched in football hold on left side.  Mom has large nipples, and letting baby latch onto nipple when he opened slightly.  Spent time explaining importance of latching baby deeply.  Assisted with hand placement and proper head support.  Hand expression demonstrated, colostrum easily expressed.  Baby able to latch after waiting for a wider gape of mouth.  Demonstrated alternate breast compression. Mom stated she felt more comfortable with the latch.  Identified multiple swallowing.  Encouraged frequent feedings, goal of >8 per 24 hrs. Mom to call for assistance as needed.   Lactation to follow up in am.  Consult Status Consult Status: Follow-up Date: 02/26/17 Follow-up type: In-patient    Crystal Huff, Harvir Patry E 02/26/2017, 3:32 PM

## 2017-02-26 NOTE — Plan of Care (Signed)
Problem: Pain Management: Goal: General experience of comfort will improve and pain level will decrease Outcome: Progressing Education done with pacific interpretor about pain medication.

## 2017-02-27 MED ORDER — IBUPROFEN 600 MG PO TABS: 600.0000 mg | ORAL_TABLET | Freq: Four times a day (QID) | ORAL | 0 refills | Status: DC

## 2017-02-27 MED ORDER — POLYETHYLENE GLYCOL 3350 17 G PO PACK: 17.0000 g | PACK | Freq: Every day | ORAL | 0 refills | Status: DC

## 2017-02-27 MED ORDER — SENNOSIDES-DOCUSATE SODIUM 8.6-50 MG PO TABS: 2.0000 | ORAL_TABLET | Freq: Every evening | ORAL | 0 refills | Status: DC | PRN

## 2017-02-27 NOTE — Discharge Summary (Signed)
OB Discharge Summary     Patient Name: Crystal Huff DOB: November 23, 1995 MRN: [id number]  Date of admission: 02/24/2017 Delivering MD: Lorne Skeens   Date of discharge: 02/27/2017  Admitting diagnosis: 40 WKS, CTXS Intrauterine pregnancy: [redacted]w[redacted]d     Secondary diagnosis:  Active Problems:   Normal labor  Additional problems:  Patient Active Problem List   Diagnosis Date Noted  . Normal labor 02/24/2017        Discharge diagnosis: Term Pregnancy Delivered                                                                                                Augmentation: AROM  Complications: None  Hospital course:  Onset of Labor With Vaginal Delivery     21 y.o. yo G1P1001 at [redacted]w[redacted]d was admitted in labor on 02/24/2017. Patient had an uncomplicated labor course as follows:  Membrane Rupture Time/Date: 11:52 PM ,02/24/2017   Intrapartum Procedures: Episiotomy: None [1]                                         Lacerations:  3rd degree [4]  Patient had a delivery of a Viable infant. 02/25/2017  Information for the patient's newborn:  Crystal Huff, Crystal Huff [[id number]]  Delivery Method: Vaginal, Vacuum (Extractor) (Filed from Delivery Summary)    Pateint had an uncomplicated postpartum course.  She is ambulating, tolerating a regular diet, passing flatus, and urinating well. Patient is discharged home in stable condition on 02/27/17.   Physical exam  Vitals:   02/26/17 0300 02/26/17 1209 02/26/17 1812 02/27/17 0538  BP: (!) 101/47 (!) 107/59 115/68 95/82  Pulse: 82 81 87 89  Resp: 18  18 18   Temp: 98.7 F (37.1 C)  98.8 F (37.1 C) 98.6 F (37 C)  TempSrc: Oral  Oral Oral  SpO2: 98%  100% 99%  Weight:      Height:       General: alert, cooperative and no distress Lochia: appropriate Uterine Fundus: firm DVT Evaluation: No evidence of DVT seen on physical exam. Labs: Lab Results  Component Value Date   WBC 19.8 (H) 02/26/2017   HGB 9.6 (L) 02/26/2017    HCT 29.1 (L) 02/26/2017   MCV 91.8 02/26/2017   PLT 173 02/26/2017   No flowsheet data found.  Discharge instruction: per After Visit Summary and "Baby and Me Booklet".  After visit meds:  Allergies as of 02/27/2017      Reactions   Latex Other (See Comments)   Causes irritation.      Medication List    STOP taking these medications   COMFORT FIT MATERNITY SUPP SM Misc     TAKE these medications   ibuprofen 600 MG tablet Commonly known as:  ADVIL,MOTRIN Take 1 tablet (600 mg total) by mouth every 6 (six) hours.   polyethylene glycol packet Commonly known as:  MIRALAX / GLYCOLAX Take 17 g by mouth daily.   prenatal multivitamin Tabs tablet Take 1 tablet by mouth  daily at 12 noon.   senna-docusate 8.6-50 MG tablet Commonly known as:  Senokot-S Take 2 tablets by mouth at bedtime as needed for mild constipation.       Diet: routine diet  Activity: Advance as tolerated. Pelvic rest for 6 weeks.   Outpatient follow up: 1 week Follow up Appt:No future appointments. Follow up Visit:No Follow-up on file.  Postpartum contraception: Depo Provera  Newborn Data: Live born female  Birth Weight: 7 lb 4 oz (3289 g) APGAR: 9, 9  Baby Feeding: Bottle and Breast Disposition:home with mother   02/27/2017 Howard PouchLauren Feng, MD   I confirm that I have verified the information documented in the resident's note and that I have also personally reperformed the physical exam and all medical decision making activities.   Discussed with patient the importance of following up with the health department in one week for + PPD.   Luna KitchensKathryn Montie Gelardi CNM

## 2017-02-27 NOTE — Lactation Note (Signed)
This note was copied from a baby's chart. Lactation Consultation Note  Patient Name: Crystal Huff BMWUX'LToday's Date: 02/27/2017   Infant was observed latching w/ease. Nutritive sucking was observed, but very few swallows were noted (perhaps infant was sleepy). Mom reports + breast changes w/pregnancy & she thinks her cup size increased somewhat. Mom's breast anatomy (somewhat underdeveloped lower quadrants) suggests that she mignt not have a full supply. Parents were shown how to look/listen for swallows. Mom was shown how to use a hand pump. I encouraged Mom to use a hand pump every time she gives formula or if she is uncomfortably full when her milk comes to volume. Parents were told to give any pumped breast milk back to baby. Parents also have some formula to take home, if needed.  I called and left a message with their WIC breastfeeding peer counselor, Crystal Huff, about the above.   FOB present during consult & said he was comfortable serving as interpreter.   Lurline HareRichey, Orbin Mayeux [facility] 02/27/2017, 11:17 AM

## 2017-02-27 NOTE — Discharge Instructions (Signed)
Instrucciones para la mamá sobre los cuidados en el hogar °(Home Care Instructions for Mom) °ACTIVIDAD °· Reanude sus actividades regulares de forma gradual. °· Descanse. Tome siestas cuando el bebé duerme. °· No levante objetos que pesen más de 10 libras (4,5 kg) hasta que el médico se lo autorice. °· Evite las actividades que demandan mucho esfuerzo y energía (que son extenuantes) hasta que el médico se lo autorice. Caminar a un ritmo tranquilo a moderado siempre es más seguro. °· Si tuvo un parto por cesárea: °? No pase la aspiradora, suba escaleras o conduzca un vehículo durante 4 o 6 semanas. °? Pídale a alguien que le brinde ayuda con las tareas domésticas hasta que pueda realizarlas por su cuenta. °? Haga ejercicios como se lo haya indicado el médico, si corresponde. ° °HEMORRAGIA VAGINAL °Probablemente continúe sangrando durante 4 o 6 semanas después del parto. Generalmente, la cantidad de sangre disminuye y el color se hace más claro con el transcurso del tiempo. Sin embargo, si usted está demasiado activa, el color de la sangre puede ser rojo brillante. Si necesita cambiarse la compresa higiénica en menos de una hora o tiene coágulos grandes: °· Permanezca acostada. °· Eleve los pies. °· Coloque compresas frías en la zona inferior del abdomen. °· Haga reposo. °· Comuníquese con su médico. °Si está amamantando, podría volver a tener su período entre las 8 semanas después del parto y el momento en que deje de amamantar. Si no está amamantando, volverá a tener su período 6 u 8 semanas después del parto. °CUIDADOS PERINEALES °La zona perineal o perineo, es la parte del cuerpo que se encuentra entre los muslos. Después del parto, esta zona necesita un cuidado especial. Siga las siguientes indicaciones como se lo haya indicado su médico. °· Tome baños de inmersión durante 15 o 20 minutos. °· Utilice apósitos o aerosoles analgésicos y cremas como se lo hayan indicado. °· No utilice tampones ni se haga duchas  vaginales hasta que el sangrado vaginal se haya detenido. °· Cada vez que vaya al baño: °? Use una botella perineal. °? Cámbiese el apósito. °? Use papel tisú en lugar de papel higiénico hasta que se cure la sutura. °· Haga ejercicios de Kegel todos los días. Los ejercicios Kegel ayudan a mantener los músculos que sostienen la vagina, la vejiga y los intestinos. Estos ejercicios se pueden realizar mientras está parada, sentada o acostada. Para hacer los ejercicios de Kegel: °? Tense los músculos del estómago y los que rodean el canal de parto. °? Mantenga esta posición durante unos segundos. °? Relájese. °? Repita hasta hacerlos 5 veces seguidas. °· Para evitar las hemorroides o que estas empeoren: °? Beba suficiente líquido para mantener la orina clara o de color amarillo pálido. °? Evite hacer fuerza al defecar. °? Tome los medicamentos y laxantes de venta libre como se lo haya indicado el médico. °CUIDADO DE LAS MAMAS °· Use un buen sostén. °· Evite tomar analgésicos de venta libre para las molestias de los pechos. °· Aplique hielo en los pechos para aliviar las molestias tanto como sea necesario: °? Ponga el hielo en una bolsa plástica. °? Coloque una toalla entre la piel y la bolsa de hielo. °? Aplique el hielo durante 20, o como se lo haya indicado el médico. ° °NUTRICIÓN °· Mantenga una dieta bien balanceada. °· No intente perder de peso rápidamente reduciendo el consumo de calorías. °· Tome sus vitaminas prenatales hasta el control de postparto o hasta que su médico se lo indique. ° °DEPRESIÓN POSTPARTO °  Puede sentir deseos de llorar sin motivo aparente y verse incapaz de enfrentarse a todos los cambios que implica tener un bebé. Este estado de ánimo se llama depresión postparto. La depresión postparto ocurre porque sus niveles hormonales sufren cambios después del parto. Si usted tiene depresión postparto, busque contención por parte de su pareja, sus amigos y su familia. Si la depresión no desaparece por  sí sola después de algunas semanas, concurra a su médico. °AUTOEXAMEN DE MAMAS °Realícese autoexámenes en el mismo momento cada mes. Si está amamantando, el mejor momento de controlar sus mamas es después de alimentar al bebé, cuando los pechos no están tan llenos. Si está amamantando y su período ya comenzó, controle sus mamas el día 5, 6 o 7 de su período. °Informe a su médico de cualquier protuberancia, bulto o secreción. Si está amamantando, las mamas normalmente tienen bultos. Esto es transitorio y no es un riesgo para la salud. °INTIMIDAD Y SEXUALIDAD °Debe evitar las relaciones sexuales durante al menos 3 o 4 semanas después del parto o hasta que el flujo de color rojo amarronado haya desaparecido completamente. Si no desea quedar embarazada nuevamente, use algún método anticonceptivo. Después del parto, puede quedar embarazada incluso si no ha tenido todavía el período. °SOLICITE ATENCIÓN MÉDICA SI: °· Se siente incapaz de controlar los cambios que implica tener un hijo y esos sentimientos no desaparecen después de algunas semanas. °· Detecta una protuberancia, bulto o secreción en sus mamas. ° °SOLICITE ATENCIÓN MÉDICA DE INMEDIATO SI: °· Debe cambiarse la compresa higiénica en 1 hora o menos. °· Tiene los siguientes síntomas: °? Dolor intenso o calambres en la parte inferior del abdomen. °? Una secreción vaginal con mal olor. °? Fiebre que no se alivia con los medicamentos. °? Una zona de la mama se pone roja y le causa dolor, y además usted tiene fiebre. °? Una pantorrilla enrojecida y con dolor. °? Repentino e intenso dolor en el pecho. °? Falta de aire. °? Micción dolorosa o con sangre. °? Problemas visuales. °· Vómitos durante 12 horas o más. °· Dolor de cabeza intenso. °· Tiene pensamientos serios acerca de lastimarse a usted misma o dañar al niño o a otra persona. ° °Esta información no tiene como fin reemplazar el consejo del médico. Asegúrese de hacerle al médico cualquier pregunta que  tenga. °Document Released: 08/26/2005 Document Revised: 12/18/2015 Document Reviewed: 02/27/2015 °Elsevier Interactive Patient Education © 2017 Elsevier Inc. ° °

## 2018-09-09 NOTE — L&D Delivery Note (Addendum)
Patient: Crystal Huff MRN: [id number]  GBS status: negative, IAP givenNA  Patient is a 23 y.o. now G2P2002 s/p NSVD at [redacted]w[redacted]d, who was admitted for SOL. SROM 0h 28m prior to delivery with clear fluid.    Delivery Note At 3:19 AM a viable and healthy female was delivered via Vaginal, Spontaneous (Presentation:vertex;ROA).  APGAR: 9, 9; weight pending.   Placenta status: delivered intact. Cord: 3-vessel with the following complications: terminal meconium, nuchal cord which was manually reduced, 2nd degree perineal laceration. IV was lost so IM pitocin was administered post-delivery.  Anesthesia:  none Episiotomy: None Lacerations: 2nd degree;Perineal Suture Repair: 3.0 vicryl Est. Blood Loss (mL):  100  Mom to postpartum.  Baby to Couplet care / Skin to Skin.  [name] 07/29/2019, 4:02 AM   Head delivered ROA. Nuchal cord present and easily reduced. Shoulder and body delivered in usual fashion. Infant with spontaneous cry, placed on mother's abdomen, dried and bulb suctioned. Cord clamped x 2 after 1-minute delay, and cut by father. Cord blood drawn. Placenta delivered spontaneously with gentle cord traction. Fundus firm with massage and Pitocin IM right thigh. Perineum inspected and found to have 2nd laceration which was repaired with 3.0 vicryl running stitch-~5throws with good hemostasis achieved.  CNM Attestation Patient is a M2N0037 at [redacted]w[redacted]d who was admitted with SOL,  essentially uncomplicated prenatal course.  She progressed without augmentation.  I was gloved and present for delivery in its entirety.  Second stage of labor progressed, baby delivered after pushing well x 15 mins. Mild decels during second stage noted.  Complications: none  Lacerations: 2nd deg perineal  EBL: 217cc  Myrtis Ser, CNM 9:07 AM 07/29/2019 (attestation removed and then the original was reapplied after correction made in resident portion of note re nuchal cord)

## 2019-01-21 ENCOUNTER — Other Ambulatory Visit (HOSPITAL_COMMUNITY): Payer: Self-pay | Admitting: Family

## 2019-01-21 DIAGNOSIS — Z3A13 13 weeks gestation of pregnancy: Secondary | ICD-10-CM

## 2019-01-21 DIAGNOSIS — Z3682 Encounter for antenatal screening for nuchal translucency: Secondary | ICD-10-CM

## 2019-01-27 ENCOUNTER — Encounter (HOSPITAL_COMMUNITY): Payer: Self-pay | Admitting: *Deleted

## 2019-02-02 ENCOUNTER — Ambulatory Visit (HOSPITAL_COMMUNITY)
Admission: RE | Admit: 2019-02-02 | Discharge: 2019-02-02 | Disposition: A | Discharge status: Home / Self Care | Payer: Self-pay | Source: Ambulatory Visit | Attending: Obstetrics and Gynecology | Admitting: Obstetrics and Gynecology

## 2019-02-02 ENCOUNTER — Ambulatory Visit (HOSPITAL_COMMUNITY): Payer: Self-pay

## 2019-02-02 ENCOUNTER — Ambulatory Visit (HOSPITAL_COMMUNITY): Payer: Self-pay | Admitting: *Deleted

## 2019-02-02 ENCOUNTER — Telehealth (HOSPITAL_COMMUNITY): Payer: Self-pay | Admitting: *Deleted

## 2019-02-02 ENCOUNTER — Other Ambulatory Visit (HOSPITAL_COMMUNITY): Payer: Self-pay | Admitting: *Deleted

## 2019-02-02 ENCOUNTER — Other Ambulatory Visit: Payer: Self-pay

## 2019-02-02 ENCOUNTER — Encounter (HOSPITAL_COMMUNITY): Payer: Self-pay | Admitting: *Deleted

## 2019-02-02 VITALS — BP 94/62 | HR 78 | Temp 98.2°F | Wt 154.6 lb

## 2019-02-02 DIAGNOSIS — Z3682 Encounter for antenatal screening for nuchal translucency: Secondary | ICD-10-CM

## 2019-02-02 DIAGNOSIS — Z363 Encounter for antenatal screening for malformations: Secondary | ICD-10-CM | POA: Insufficient documentation

## 2019-02-02 DIAGNOSIS — O99211 Obesity complicating pregnancy, first trimester: Secondary | ICD-10-CM

## 2019-02-02 DIAGNOSIS — Z3A13 13 weeks gestation of pregnancy: Secondary | ICD-10-CM | POA: Insufficient documentation

## 2019-02-02 DIAGNOSIS — Z369 Encounter for antenatal screening, unspecified: Secondary | ICD-10-CM

## 2019-02-02 DIAGNOSIS — Z3A12 12 weeks gestation of pregnancy: Secondary | ICD-10-CM

## 2019-02-02 DIAGNOSIS — Z6841 Body Mass Index (BMI) 40.0 and over, adult: Secondary | ICD-10-CM

## 2019-02-02 NOTE — Progress Notes (Signed)
94/62

## 2019-02-07 LAB — MATERNIT 21 PLUS CORE, BLOOD
Fetal Fraction: 9
Result (T21): NEGATIVE
Trisomy 13 (Patau syndrome): NEGATIVE
Trisomy 18 (Edwards syndrome): NEGATIVE
Trisomy 21 (Down syndrome): NEGATIVE

## 2019-02-09 NOTE — Telephone Encounter (Signed)
Left message for patient to return call regarding lab results with [facility] spanish interpreter.

## 2019-02-10 ENCOUNTER — Telehealth (HOSPITAL_COMMUNITY): Payer: Self-pay | Admitting: *Deleted

## 2019-02-10 LAB — FIRST TRIMESTER SCREEN W/NT

## 2019-02-10 LAB — STATUS REPORT

## 2019-03-16 ENCOUNTER — Other Ambulatory Visit: Payer: Self-pay

## 2019-03-16 ENCOUNTER — Other Ambulatory Visit (HOSPITAL_COMMUNITY): Payer: Self-pay | Admitting: *Deleted

## 2019-03-16 ENCOUNTER — Ambulatory Visit (HOSPITAL_COMMUNITY)
Admission: RE | Admit: 2019-03-16 | Discharge: 2019-03-16 | Disposition: A | Discharge status: Home / Self Care | Payer: Self-pay | Source: Ambulatory Visit | Attending: Obstetrics and Gynecology | Admitting: Obstetrics and Gynecology

## 2019-03-16 DIAGNOSIS — Z3A18 18 weeks gestation of pregnancy: Secondary | ICD-10-CM

## 2019-03-16 DIAGNOSIS — Z362 Encounter for other antenatal screening follow-up: Secondary | ICD-10-CM

## 2019-03-16 DIAGNOSIS — O99212 Obesity complicating pregnancy, second trimester: Secondary | ICD-10-CM

## 2019-03-16 DIAGNOSIS — Z6841 Body Mass Index (BMI) 40.0 and over, adult: Secondary | ICD-10-CM | POA: Insufficient documentation

## 2019-04-01 NOTE — Telephone Encounter (Signed)
Neg Mat 21 results reported to pt.

## 2019-04-13 ENCOUNTER — Other Ambulatory Visit: Payer: Self-pay

## 2019-04-13 ENCOUNTER — Ambulatory Visit (HOSPITAL_COMMUNITY)
Admission: RE | Admit: 2019-04-13 | Discharge: 2019-04-13 | Disposition: A | Discharge status: Home / Self Care | Payer: Self-pay | Source: Ambulatory Visit | Attending: Obstetrics and Gynecology | Admitting: Obstetrics and Gynecology

## 2019-04-13 DIAGNOSIS — Z3A22 22 weeks gestation of pregnancy: Secondary | ICD-10-CM

## 2019-04-13 DIAGNOSIS — O99212 Obesity complicating pregnancy, second trimester: Secondary | ICD-10-CM

## 2019-04-13 DIAGNOSIS — Z362 Encounter for other antenatal screening follow-up: Secondary | ICD-10-CM | POA: Insufficient documentation

## 2019-05-13 ENCOUNTER — Other Ambulatory Visit: Payer: Self-pay

## 2019-05-13 ENCOUNTER — Ambulatory Visit
Admission: RE | Admit: 2019-05-13 | Discharge: 2019-05-13 | Disposition: A | Discharge status: Home / Self Care | Payer: Self-pay | Source: Ambulatory Visit | Attending: Internal Medicine | Admitting: Internal Medicine

## 2019-05-13 ENCOUNTER — Other Ambulatory Visit: Payer: Self-pay | Admitting: Internal Medicine

## 2019-05-13 DIAGNOSIS — R7612 Nonspecific reaction to cell mediated immunity measurement of gamma interferon antigen response without active tuberculosis: Secondary | ICD-10-CM

## 2019-07-15 LAB — OB RESULTS CONSOLE GBS: GBS: NEGATIVE

## 2019-07-28 ENCOUNTER — Other Ambulatory Visit: Payer: Self-pay

## 2019-07-28 ENCOUNTER — Inpatient Hospital Stay (HOSPITAL_COMMUNITY)
Admission: AD | Admit: 2019-07-28 | Discharge: 2019-07-30 | DRG: 807 | Disposition: A | Discharge status: Home / Self Care | Payer: Medicaid Other | Attending: Obstetrics & Gynecology | Admitting: Obstetrics & Gynecology

## 2019-07-28 DIAGNOSIS — R7611 Nonspecific reaction to tuberculin skin test without active tuberculosis: Secondary | ICD-10-CM | POA: Diagnosis present

## 2019-07-28 DIAGNOSIS — E669 Obesity, unspecified: Secondary | ICD-10-CM | POA: Diagnosis present

## 2019-07-28 DIAGNOSIS — Z20828 Contact with and (suspected) exposure to other viral communicable diseases: Secondary | ICD-10-CM | POA: Diagnosis present

## 2019-07-28 DIAGNOSIS — O99214 Obesity complicating childbirth: Secondary | ICD-10-CM | POA: Diagnosis present

## 2019-07-28 DIAGNOSIS — Z3A38 38 weeks gestation of pregnancy: Secondary | ICD-10-CM

## 2019-07-28 DIAGNOSIS — Z789 Other specified health status: Secondary | ICD-10-CM | POA: Diagnosis present

## 2019-07-29 ENCOUNTER — Encounter (HOSPITAL_COMMUNITY): Payer: Self-pay

## 2019-07-29 ENCOUNTER — Encounter (HOSPITAL_COMMUNITY): Payer: Self-pay | Admitting: Anesthesiology

## 2019-07-29 DIAGNOSIS — Z20828 Contact with and (suspected) exposure to other viral communicable diseases: Secondary | ICD-10-CM | POA: Diagnosis present

## 2019-07-29 DIAGNOSIS — O99214 Obesity complicating childbirth: Secondary | ICD-10-CM | POA: Diagnosis present

## 2019-07-29 DIAGNOSIS — Z3A38 38 weeks gestation of pregnancy: Secondary | ICD-10-CM | POA: Diagnosis not present

## 2019-07-29 DIAGNOSIS — Z789 Other specified health status: Secondary | ICD-10-CM | POA: Diagnosis present

## 2019-07-29 DIAGNOSIS — O26893 Other specified pregnancy related conditions, third trimester: Secondary | ICD-10-CM | POA: Diagnosis present

## 2019-07-29 DIAGNOSIS — E669 Obesity, unspecified: Secondary | ICD-10-CM | POA: Diagnosis present

## 2019-07-29 DIAGNOSIS — R7611 Nonspecific reaction to tuberculin skin test without active tuberculosis: Secondary | ICD-10-CM | POA: Diagnosis present

## 2019-07-29 LAB — CBC
HCT: 38.4 % (ref 36.0–46.0)
Hemoglobin: 12.2 g/dL (ref 12.0–15.0)
MCH: 29.7 pg (ref 26.0–34.0)
MCHC: 31.8 g/dL (ref 30.0–36.0)
MCV: 93.4 fL (ref 80.0–100.0)
Platelets: 197 10*3/uL (ref 150–400)
RBC: 4.11 MIL/uL (ref 3.87–5.11)
RDW: 13.4 % (ref 11.5–15.5)
WBC: 12.2 10*3/uL — ABNORMAL HIGH (ref 4.0–10.5)
nRBC: 0 % (ref 0.0–0.2)

## 2019-07-29 LAB — TYPE AND SCREEN
ABO/RH(D): A POS
Antibody Screen: NEGATIVE

## 2019-07-29 LAB — RPR: RPR Ser Ql: NONREACTIVE

## 2019-07-29 LAB — SARS CORONAVIRUS 2 (TAT 6-24 HRS): SARS Coronavirus 2: NEGATIVE

## 2019-07-29 LAB — ABO/RH: ABO/RH(D): A POS

## 2019-07-29 MED ORDER — ZOLPIDEM TARTRATE 5 MG PO TABS: 5.0000 mg | ORAL_TABLET | Freq: Every evening | ORAL | Status: DC | PRN

## 2019-07-29 MED ORDER — DIBUCAINE (PERIANAL) 1 % EX OINT: 1.0000 "application " | TOPICAL_OINTMENT | CUTANEOUS | Status: DC | PRN

## 2019-07-29 MED ORDER — OXYCODONE-ACETAMINOPHEN 5-325 MG PO TABS: 2.0000 | ORAL_TABLET | ORAL | Status: DC | PRN

## 2019-07-29 MED ORDER — ONDANSETRON HCL 4 MG/2ML IJ SOLN: 4.0000 mg | INTRAMUSCULAR | Status: DC | PRN

## 2019-07-29 MED ORDER — IBUPROFEN 600 MG PO TABS
600.0000 mg | ORAL_TABLET | Freq: Four times a day (QID) | ORAL | Status: DC
  Administered 2019-07-29 – 2019-07-30 (×6): 600 mg via ORAL
  Filled 2019-07-29 (×6): qty 1

## 2019-07-29 MED ORDER — LIDOCAINE HCL (PF) 1 % IJ SOLN
30.0000 mL | INTRAMUSCULAR | Status: AC | PRN
  Administered 2019-07-29: 30 mL via SUBCUTANEOUS
  Filled 2019-07-29: qty 30

## 2019-07-29 MED ORDER — FLEET ENEMA 7-19 GM/118ML RE ENEM: 1.0000 | ENEMA | RECTAL | Status: DC | PRN

## 2019-07-29 MED ORDER — PHENYLEPHRINE 40 MCG/ML (10ML) SYRINGE FOR IV PUSH (FOR BLOOD PRESSURE SUPPORT): 80.0000 ug | PREFILLED_SYRINGE | INTRAVENOUS | Status: DC | PRN

## 2019-07-29 MED ORDER — SOD CITRATE-CITRIC ACID 500-334 MG/5ML PO SOLN: 30.0000 mL | ORAL | Status: DC | PRN

## 2019-07-29 MED ORDER — SIMETHICONE 80 MG PO CHEW: 80.0000 mg | CHEWABLE_TABLET | ORAL | Status: DC | PRN

## 2019-07-29 MED ORDER — LACTATED RINGERS IV SOLN: 500.0000 mL | Freq: Once | INTRAVENOUS | Status: DC

## 2019-07-29 MED ORDER — SENNOSIDES-DOCUSATE SODIUM 8.6-50 MG PO TABS
2.0000 | ORAL_TABLET | ORAL | Status: DC
  Administered 2019-07-29: 2 via ORAL
  Filled 2019-07-29: qty 2

## 2019-07-29 MED ORDER — WITCH HAZEL-GLYCERIN EX PADS: 1.0000 "application " | MEDICATED_PAD | CUTANEOUS | Status: DC | PRN

## 2019-07-29 MED ORDER — OXYTOCIN 10 UNIT/ML IJ SOLN
INTRAMUSCULAR | Status: AC
  Administered 2019-07-29: 10 [IU] via INTRAMUSCULAR
  Filled 2019-07-29: qty 1

## 2019-07-29 MED ORDER — ONDANSETRON HCL 4 MG PO TABS: 4.0000 mg | ORAL_TABLET | ORAL | Status: DC | PRN

## 2019-07-29 MED ORDER — DIPHENHYDRAMINE HCL 25 MG PO CAPS: 25.0000 mg | ORAL_CAPSULE | Freq: Four times a day (QID) | ORAL | Status: DC | PRN

## 2019-07-29 MED ORDER — FENTANYL-BUPIVACAINE-NACL 0.5-0.125-0.9 MG/250ML-% EP SOLN
EPIDURAL | Status: AC
  Filled 2019-07-29: qty 250

## 2019-07-29 MED ORDER — PRENATAL MULTIVITAMIN CH
1.0000 | ORAL_TABLET | Freq: Every day | ORAL | Status: DC
  Administered 2019-07-29 – 2019-07-30 (×2): 1 via ORAL
  Filled 2019-07-29 (×2): qty 1

## 2019-07-29 MED ORDER — EPHEDRINE 5 MG/ML INJ: 10.0000 mg | INTRAVENOUS | Status: DC | PRN

## 2019-07-29 MED ORDER — OXYCODONE-ACETAMINOPHEN 5-325 MG PO TABS: 1.0000 | ORAL_TABLET | ORAL | Status: DC | PRN

## 2019-07-29 MED ORDER — LACTATED RINGERS IV SOLN
INTRAVENOUS | Status: DC
  Administered 2019-07-29: 02:00:00 via INTRAVENOUS

## 2019-07-29 MED ORDER — OXYTOCIN 40 UNITS IN NORMAL SALINE INFUSION - SIMPLE MED: 2.5000 [IU]/h | INTRAVENOUS | Status: DC

## 2019-07-29 MED ORDER — FENTANYL-BUPIVACAINE-NACL 0.5-0.125-0.9 MG/250ML-% EP SOLN
12.0000 mL/h | EPIDURAL | Status: DC | PRN
  Filled 2019-07-29: qty 250

## 2019-07-29 MED ORDER — COCONUT OIL OIL: 1.0000 "application " | TOPICAL_OIL | Status: DC | PRN

## 2019-07-29 MED ORDER — ACETAMINOPHEN 325 MG PO TABS: 650.0000 mg | ORAL_TABLET | ORAL | Status: DC | PRN

## 2019-07-29 MED ORDER — BENZOCAINE-MENTHOL 20-0.5 % EX AERO
1.0000 "application " | INHALATION_SPRAY | CUTANEOUS | Status: DC | PRN
  Administered 2019-07-29: 1 via TOPICAL
  Filled 2019-07-29: qty 56

## 2019-07-29 MED ORDER — LACTATED RINGERS IV SOLN: 500.0000 mL | INTRAVENOUS | Status: DC | PRN

## 2019-07-29 MED ORDER — TETANUS-DIPHTH-ACELL PERTUSSIS 5-2.5-18.5 LF-MCG/0.5 IM SUSP: 0.5000 mL | Freq: Once | INTRAMUSCULAR | Status: DC

## 2019-07-29 MED ORDER — OXYTOCIN BOLUS FROM INFUSION: 500.0000 mL | Freq: Once | INTRAVENOUS | Status: DC

## 2019-07-29 MED ORDER — ONDANSETRON HCL 4 MG/2ML IJ SOLN: 4.0000 mg | Freq: Four times a day (QID) | INTRAMUSCULAR | Status: DC | PRN

## 2019-07-29 MED ORDER — ACETAMINOPHEN 325 MG PO TABS
650.0000 mg | ORAL_TABLET | ORAL | Status: DC | PRN
  Administered 2019-07-29: 650 mg via ORAL
  Filled 2019-07-29: qty 2

## 2019-07-29 MED ORDER — LACTATED RINGERS IV SOLN
500.0000 mL | Freq: Once | INTRAVENOUS | Status: AC
  Administered 2019-07-29: 500 mL via INTRAVENOUS

## 2019-07-29 MED ORDER — DIPHENHYDRAMINE HCL 50 MG/ML IJ SOLN: 12.5000 mg | INTRAMUSCULAR | Status: DC | PRN

## 2019-07-29 NOTE — Lactation Note (Signed)
This note was copied from a baby's chart. Lactation Consultation Note  Patient Name: Crystal Huff Today's Date: 07/29/2019 Reason for consult: Initial assessment;Early term 37-38.6wks  [id number] Spanish interpreter Lorn Junes utilized for this interaction  Initial visit with P2 mom who delivered @ 38.3wks, baby is now 25 hours old.  Mom reports breastfeeding is going well. Initially baby was only latching to tip of nipple but mom has worked with baby and now can achieve a deeper latch that is more comfortable. Mom states she breastfed her first baby for 2 years without difficulty. Mom states she feels very prepared for breastfeeding this time as she remembers most of what she learned from the LCs with her first child.  Reviewed feeding 8-12 times in 24 hours and with feeding cues. Reviewed expected output and to monitor for wet diapers. FOB states he has changed one stool diaper already. Reviewed ways to wake a sleepy baby for feedings including removing clothing, changing diaper, hand expression and STS contact. Encouraged STS contact as much as possible. Reviewed alternating breasts each feeding and alternating positions. Mom states she has only been doing the cradle hold position.  Infant gagged slightly, picked up by [facility] and back blows performed. Small amount of clear emesis produced.  Spanish lactation brochure left at bedside with phone numbers. Notified mom of IP/OP lactation services. Mom appears very confident with breastfeeding.   Interventions Interventions: Breast feeding basics reviewed;Skin to skin;Position options;Support pillows    Consult Status Consult Status: Follow-up Date: 07/30/19 Follow-up type: In-patient    Cranston Neighbor 07/29/2019, 2:17 PM

## 2019-07-29 NOTE — MAU Note (Addendum)
Pt reports to MAU complaining of CTX every 5 minutes. +FM. No vaginal bleeding. Some pink vaginal discharge. Pt states last check in the office was 1cm.

## 2019-07-29 NOTE — Progress Notes (Signed)
Ordered meals and check on supplies, by [employer].

## 2019-07-29 NOTE — Anesthesia Preprocedure Evaluation (Deleted)
Anesthesia Evaluation  Patient identified by MRN, date of birth, ID band Patient awake    Reviewed: Allergy & Precautions, Patient's Chart, lab work & pertinent test results  Airway Mallampati: II  TM Distance: >3 FB Neck ROM: Full    Dental no notable dental hx. (+) Teeth Intact   Pulmonary neg pulmonary ROS,    Pulmonary exam normal breath sounds clear to auscultation       Cardiovascular negative cardio ROS Normal cardiovascular exam Rhythm:Regular Rate:Normal     Neuro/Psych negative neurological ROS  negative psych ROS   GI/Hepatic negative GI ROS, Neg liver ROS,   Endo/Other  Obesity  Renal/GU negative Renal ROS  negative genitourinary   Musculoskeletal   Abdominal (+) + obese,   Peds  Hematology negative hematology ROS (+)   Anesthesia Other Findings   Reproductive/Obstetrics                             Anesthesia Physical Anesthesia Plan  ASA: II  Anesthesia Plan: Epidural   Post-op Pain Management:    Induction:   PONV Risk Score and Plan:   Airway Management Planned: Natural Airway  Additional Equipment:   Intra-op Plan:   Post-operative Plan:   Informed Consent: I have reviewed the patients History and Physical, chart, labs and discussed the procedure including the risks, benefits and alternatives for the proposed anesthesia with the patient or authorized representative who has indicated his/her understanding and acceptance.       Plan Discussed with: Anesthesiologist  Anesthesia Plan Comments: (Patient felt urge to push when she sat up. She was completely dilated. Epidural not performed. M. Royce Macadamia, MD)       Anesthesia Quick Evaluation

## 2019-07-29 NOTE — Discharge Summary (Addendum)
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 yo G2P1001 at 2500w3das admitted in Active Labor on 07/28/2019. Patient had an uncomplicated labor course, delivering approximately 40 mins after SROM.  Membrane Rupture Time/Date: 2:40 AM ,07/29/2019   Intrapartum Procedures: Episiotomy: None [1]                                         Lacerations:  2nd degree [3];Perineal [11]  Patient had a delivery of a Viable infant. 07/29/2019  Information for the patient's newborn:  VaElnore CosensGiGodfrey0[[id number]]Delivery Method: Vaginal, Spontaneous(Filed from Delivery Summary)     Pateint had an uncomplicated postpartum course.  She is ambulating, tolerating a regular diet, passing flatus, and urinating well. Patient is discharged home in stable condition on 07/30/19.  Delivery time: 3:19 AM    Magnesium Sulfate received: No BMZ received: No Rhophylac:No MMR:No Transfusion:No  Physical exam  Vitals:   07/29/19 1201 07/29/19 1603 07/29/19 2009 07/30/19 0620  BP: 111/61 114/68 98/66 105/69  Pulse: 78 74 69 61  Resp: 18 18 18 16   Temp: 99 F (37.2 C) 98.7 F (37.1 C) 98.6 F (37 C) 97.7 F (36.5 C)  TempSrc: Oral Oral Oral Oral  SpO2: 98% 99% 98% 99%   Weight:      Height:       General: alert, cooperative and no distress Lochia: appropriate Uterine Fundus: firm Incision: N/A DVT Evaluation: No evidence of DVT seen on physical exam. Labs: Lab Results  Component Value Date   WBC 12.2 (H) 07/29/2019   HGB 12.2 07/29/2019   HCT 38.4 07/29/2019   MCV 93.4 07/29/2019   PLT 197 07/29/2019   No flowsheet data found.  Discharge instruction: per After Visit Summary and "Baby and Me Booklet".  After visit meds:  Allergies as of 07/30/2019      Reactions   Latex Other (See Comments)   Causes irritation.      Medication List    TAKE these medications   ibuprofen 600 MG tablet Commonly known as: ADVIL Take 1 tablet (600 mg total) by mouth every 6 (six) hours.   polyethylene glycol 17 g packet Commonly known as: MIRALAX / GLYCOLAX Take  17 g by mouth daily.   prenatal multivitamin Tabs tablet Take 1 tablet by mouth daily at 12 noon.   senna-docusate 8.6-50 MG tablet Commonly known as: Senokot-S Take 2 tablets by mouth at bedtime as needed for mild constipation.   Tdap 5-2.5-18.5 LF-MCG/0.5 injection Commonly known as: BOOSTRIX Inject 0.5 mLs into the muscle once for 1 dose.   witch hazel-glycerin pad Commonly known as: TUCKS Apply 1 application topically as needed for hemorrhoids.       Diet: routine diet  Activity: Advance as tolerated. Pelvic rest for 6 weeks.   Outpatient follow up:4 weeks Follow up Appt:No future appointments. Follow up Visit: pt to schedule at [facility]: Live born female  Birth Weight: 3374gm (7lb 7oz)  APGAR: 46, 9  Newborn Delivery   Birth date/time: 07/29/2019 03:19:00 Delivery type: Vaginal, Spontaneous      Baby Feeding: Bottle and Breast Disposition:home with mother   07/30/2019 Starr [place], CNM

## 2019-07-29 NOTE — Plan of Care (Signed)

## 2019-07-29 NOTE — MAU Note (Signed)
Covid swab obtained without difficulty and pt tol well. No symptoms 

## 2019-07-29 NOTE — H&P (Addendum)
LABOR AND DELIVERY ADMISSION HISTORY AND PHYSICAL NOTE  Crystal Huff is a 23 y.o. female [place] with IUP at [redacted]w[redacted]d by 11w [place] on 01/25/2019 presenting for SOL.  She reports positive fetal movement. She denies leakage of fluid or vaginal bleeding.  Prenatal History/Complications: [facility] at health department Pregnancy complications:  - unknown LMP. 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 Medical history non-contributory     Past Surgical History: Past Surgical History:  Procedure Laterality Date  . NO PAST SURGERIES      Obstetrical History: OB History    Gravida  2   Para  1   Term  1   Preterm      AB      Living  1     SAB      TAB      Ectopic      Multiple  0   Live Births  1           Social History: Social History   Socioeconomic History  . Marital status: Married    Spouse name: Not on file  . Number of children: Not on file  . Years of education: Not on file  . Highest education level: Not on file  Occupational History  . Not on file  Social Needs  . Financial resource strain: Not on file  . Food insecurity    Worry: Not on file    Inability: Not on file  . Transportation needs    Medical: Not on file    Non-medical: Not on file  Tobacco Use  . Smoking status: Never Smoker  . Smokeless tobacco: Never Used  Substance and Sexual Activity  . Alcohol use: No  . Drug use: No  . Sexual activity: Yes  Lifestyle  . Physical activity    Days per week: Not on file    Minutes per session: Not on file  . Stress: Not on file  Relationships  . Social Herbalist on phone: Not on file    Gets together: Not on file    Attends religious service: Not on file    Active member of club or organization: Not on file    Attends  meetings of clubs or organizations: Not on file    Relationship status: Not on file  Other Topics Concern  . Not on file  Social History Narrative  . Not on file    Family History: Family History  Problem Relation Age of Onset  . Diabetes Maternal Grandmother     Allergies: Allergies  Allergen Reactions  . Latex Other (See Comments)    Causes irritation.    Medications Prior to Admission  Medication Sig Dispense Refill Last Dose  . Prenatal Vit-Fe Fumarate-FA (PRENATAL MULTIVITAMIN) TABS tablet Take 1 tablet by mouth daily at 12 noon.   07/28/2019 at Unknown time  . ibuprofen (ADVIL,MOTRIN) 600 MG tablet Take 1 tablet (600 mg total) by mouth every 6 (six) hours. (Patient not taking: Reported on 02/02/2019) 30 tablet 0   . polyethylene glycol (MIRALAX / GLYCOLAX) packet Take 17 g by mouth daily. (Patient not taking: Reported on 02/02/2019) 14 each 0   . senna-docusate (SENOKOT-S) 8.6-50 MG tablet Take 2 tablets by mouth at bedtime as needed for mild  constipation. (Patient not taking: Reported on 02/02/2019) 30 tablet 0      Review of Systems  All systems reviewed and negative except as stated in HPI  Physical Exam Blood pressure 121/76, pulse 78, weight 90.1 kg, last menstrual period 11/02/2018, unknown if currently breastfeeding. General appearance: alert, oriented, NAD Lungs: normal respiratory effort Heart: regular rate Abdomen: soft, non-tender; gravid, FH appropriate for GA Extremities: No calf swelling or tenderness Presentation: cephalic Fetal monitoring: cat I Uterine activity: moderate  Dilation: 6 Effacement (%): 90, 80 Station: -2 Exam by:: Frazier Butt, RN; Manuela Neptune, RN  Prenatal labs: ABO, Rh:  A pos Antibody:  neg Rubella:  immune RPR:   neg HBsAg:   neg HIV:   neg GC/Chlamydia: neg GBS: Negative/-- (11/05 0000)  2-hr GTT: na, neg 1 hr Genetic screening:  Normal Anatomy US: normal  Prenatal Transfer Tool  Maternal Diabetes: No Genetic  Screening: Normal Maternal Ultrasounds/Referrals: Normal Fetal Ultrasounds or other Referrals:  None Maternal Substance Abuse:  No Significant Maternal Medications:  None Significant Maternal Lab Results: Group B Strep negative  Patient Active Problem List   Diagnosis Date Noted  . Normal labor 02/24/2017    Assessment: Crystal Huff is a 23 y.o. G2P1001 at [redacted]w[redacted]d here for SOL. Patient progressing well and feeling pelvic pressure. Membranes intact. Wanting epidural to be placed now. Will plan expectant management for now and can consider AROM or pit in the future if necessary.   #Labor: expectant management #Pain: Epidural being placed #FWB: Cat I #ID:  GBS neg  Jamelle Rushing, DO PGY-2 FM 07/29/2019, 2:05 AM   CNM attestation:  I have seen and examined this patient; I agree with above documentation in the resident's note.   Crystal Huff is a 23 y.o. G2P1001 here for SOL  PE: BP 122/62   Pulse 90   Temp 99.3 F (37.4 C) (Oral)   Resp 18   Ht 5\' 2"  (1.575 m)   Wt 90.1 kg   LMP 11/02/2018 (Approximate)   BMI 36.32 kg/m  Gen: breathing w/ ctx Resp: normal effort, no distress Abd: gravid  ROS, labs, PMH reviewed  Plan: Admit to Labor and Delivery Expectant management Epidural as able Anticipate vag del  11/04/2018 CNM 07/29/2019, 3:15 AM

## 2019-07-30 MED ORDER — WITCH HAZEL-GLYCERIN EX PADS: 1.0000 "application " | MEDICATED_PAD | CUTANEOUS | 12 refills | Status: DC | PRN

## 2019-07-30 MED ORDER — TETANUS-DIPHTH-ACELL PERTUSSIS 5-2.5-18.5 LF-MCG/0.5 IM SUSP: 0.5000 mL | Freq: Once | INTRAMUSCULAR | 0 refills | Status: AC

## 2019-07-30 NOTE — Lactation Note (Signed)
This note was copied from a baby's chart. Lactation Consultation Note  Patient Name: Girl Beila Purdie HOZYY'Q Date: 07/30/2019 Reason for consult: Follow-up assessment;Early term 37-38.6wks;Infant weight loss  89 hours old ETI who is now being partially BF and formula fed by her mother, she's a P2 and experienced BF. Mom and baby are going home today, baby is at 5% weight loss. She started supplementing this morning because baby was cluster feeding last night and she felt "she didn't have enough". Mom's feeding choice on admission was to do both, breast and formula. She was given a hand pump but she hasn't used it yet, but mom said she's familiar with Medela products; she'll be taking the hand pump home with her today.  Mom told LC that baby just fed some formula right before LC came in the room. Parents gave her 48 ml of Gerber Gentle. Baby still gaggy, [place] tried to burp her but she kept falling asleep. Stressed to parents the importance of not overfeeding baby at such an early age, and how supplementation can hurt BF. Reviewed formula supplementation guidelines (SP handout given), discharge instructions, engorgement prevention and treatment, treatment/prevention for sore nipples and red flags on when to call baby's pediatrician.   Parents reported all questions and concerns were answered, they're both aware of [facility] OP services and will contact PRN.  Maternal Data    Feeding Feeding Type: Breast Fed  LATCH Score                   Interventions Interventions: Breast feeding basics reviewed;Hand pump  Lactation Tools Discussed/Used     Consult Status Consult Status: Complete Date: 07/30/19 Follow-up type: Call as needed    [place] 07/30/2019, 1:00 PM

## 2020-11-27 IMAGING — CR DG CHEST 1V
1 series · 1 of 1 positions shown · non-contrast
Comparison: 12/16/2016

CLINICAL DATA: Positive PPD, positive QuantiFERON

EXAM:
CHEST  1 VIEW

[w chest pa]
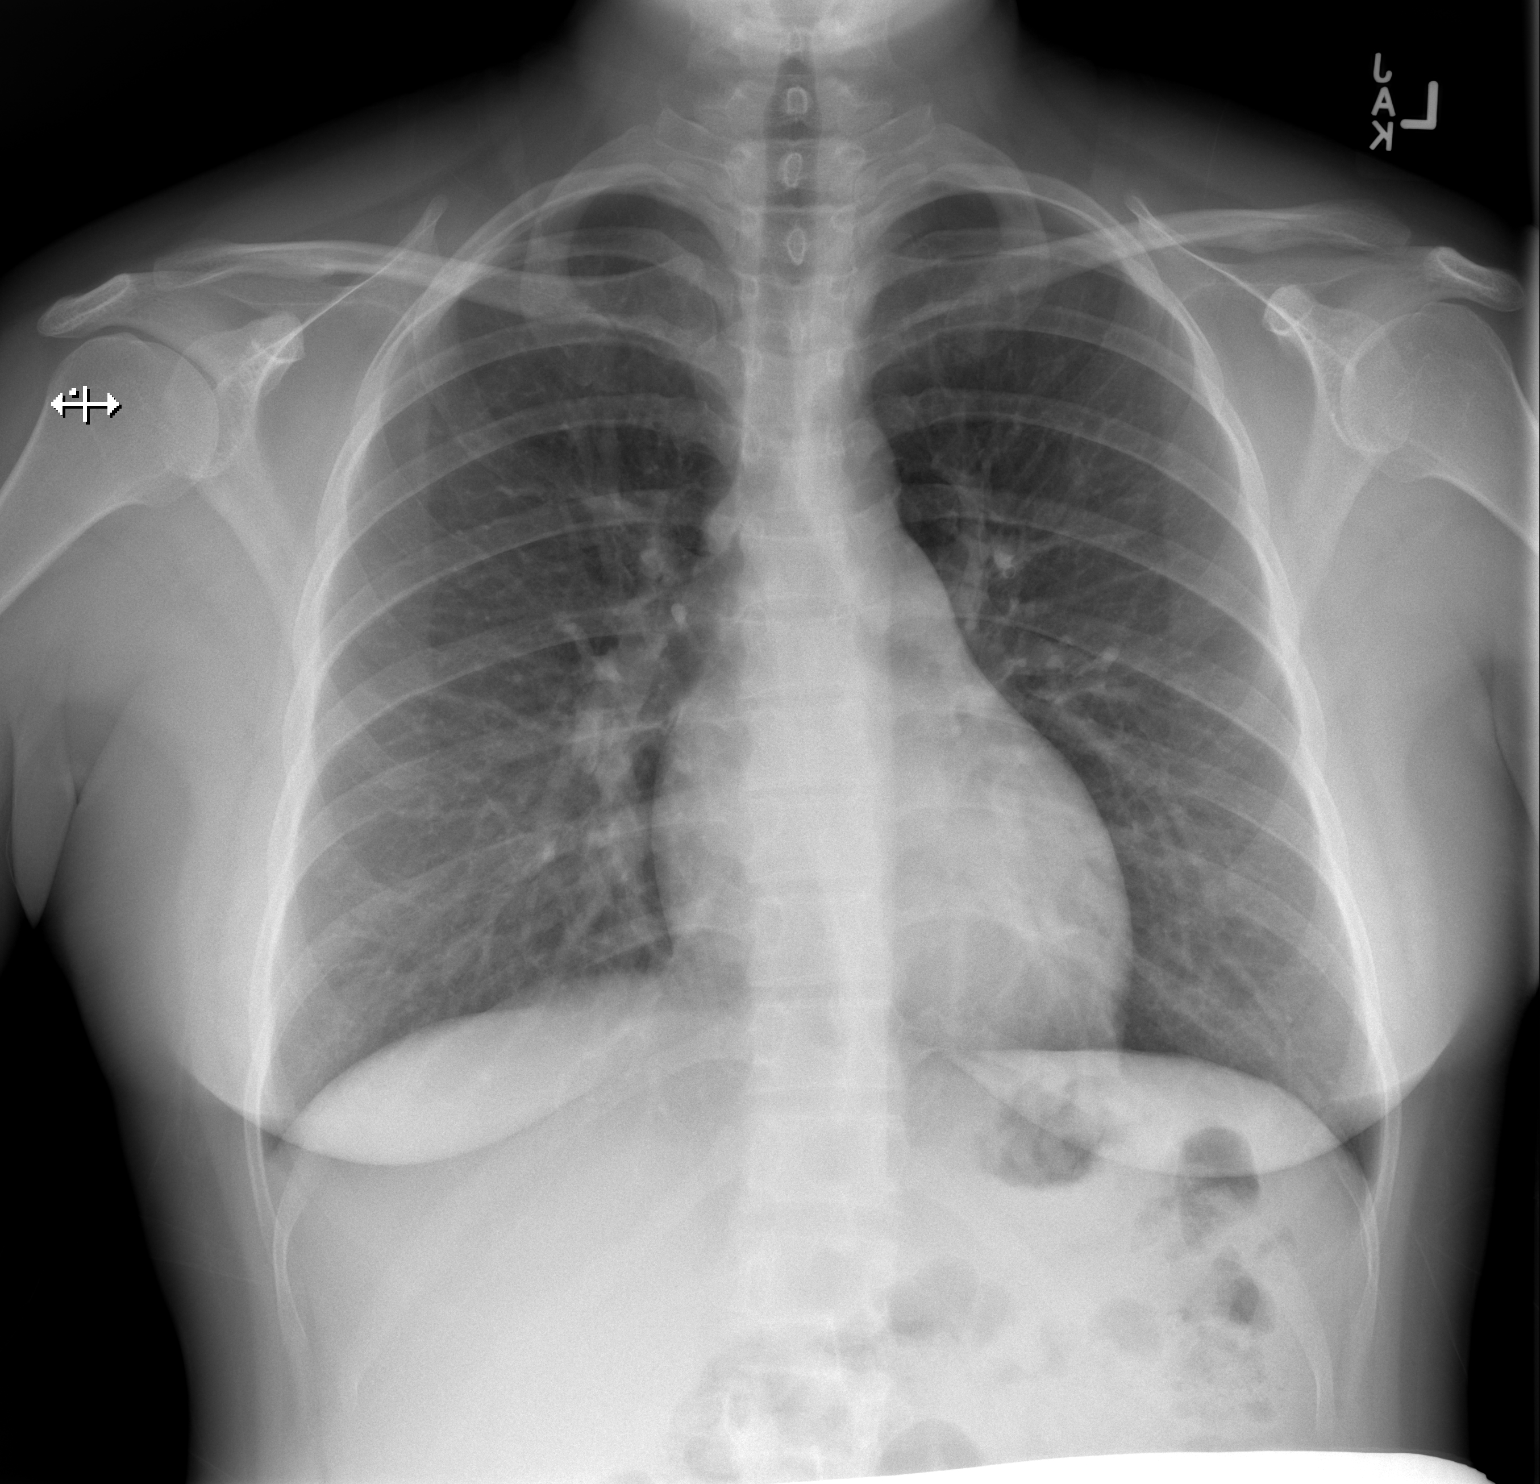

[1 of 1 positions shown; findings below may reference images not displayed]

FINDINGS: The heart size and mediastinal contours are within normal limits.
Both lungs are clear. The visualized skeletal structures are
unremarkable.
IMPRESSION: No active disease.

## 2023-06-19 ENCOUNTER — Other Ambulatory Visit: Payer: Self-pay

## 2023-06-19 ENCOUNTER — Encounter (HOSPITAL_COMMUNITY): Payer: Self-pay

## 2023-06-19 ENCOUNTER — Ambulatory Visit (HOSPITAL_COMMUNITY)
Admission: EM | Admit: 2023-06-19 | Discharge: 2023-06-19 | Disposition: A | Discharge status: Home / Self Care | Payer: Self-pay | Attending: Family Medicine | Admitting: Family Medicine

## 2023-06-19 ENCOUNTER — Emergency Department (HOSPITAL_COMMUNITY)
Admission: EM | Admit: 2023-06-19 | Discharge: 2023-06-20 | Disposition: A | Discharge status: Home / Self Care | Payer: Self-pay | Attending: Emergency Medicine | Admitting: Emergency Medicine

## 2023-06-19 DIAGNOSIS — Z91040 Latex allergy status: Secondary | ICD-10-CM | POA: Insufficient documentation

## 2023-06-19 DIAGNOSIS — R112 Nausea with vomiting, unspecified: Secondary | ICD-10-CM

## 2023-06-19 DIAGNOSIS — R1031 Right lower quadrant pain: Secondary | ICD-10-CM | POA: Insufficient documentation

## 2023-06-19 DIAGNOSIS — R109 Unspecified abdominal pain: Secondary | ICD-10-CM

## 2023-06-19 LAB — COMPREHENSIVE METABOLIC PANEL
ALT: 25 U/L (ref 0–44)
AST: 22 U/L (ref 15–41)
Albumin: 4 g/dL (ref 3.5–5.0)
Alkaline Phosphatase: 68 U/L (ref 38–126)
Anion gap: 9 (ref 5–15)
BUN: 9 mg/dL (ref 6–20)
CO2: 24 mmol/L (ref 22–32)
Calcium: 9 mg/dL (ref 8.9–10.3)
Chloride: 105 mmol/L (ref 98–111)
Creatinine, Ser: 0.76 mg/dL (ref 0.44–1.00)
GFR, Estimated: >60 mL/min (ref >60)
Glucose, Bld: 95 mg/dL (ref 70–99)
Potassium: 3.4 mmol/L — ABNORMAL LOW (ref 3.5–5.1)
Sodium: 138 mmol/L (ref 135–145)
Total Bilirubin: 0.6 mg/dL (ref 0.3–1.2)
Total Protein: 7.2 g/dL (ref 6.5–8.1)

## 2023-06-19 LAB — POCT URINALYSIS DIP (MANUAL ENTRY)
Bilirubin, UA: NEGATIVE
Blood, UA: NEGATIVE
Glucose, UA: NEGATIVE mg/dL
Ketones, POC UA: NEGATIVE mg/dL
Nitrite, UA: NEGATIVE
Protein Ur, POC: NEGATIVE mg/dL
Spec Grav, UA: 1.015 (ref 1.010–1.025)
Urobilinogen, UA: 0.2 U/dL
pH, UA: 7.5 (ref 5.0–8.0)

## 2023-06-19 LAB — CBC
HCT: 42.7 % (ref 36.0–46.0)
Hemoglobin: 14.1 g/dL (ref 12.0–15.0)
MCH: 30.8 pg (ref 26.0–34.0)
MCHC: 33 g/dL (ref 30.0–36.0)
MCV: 93.2 fL (ref 80.0–100.0)
Platelets: 203 10*3/uL (ref 150–400)
RBC: 4.58 MIL/uL (ref 3.87–5.11)
RDW: 12.2 % (ref 11.5–15.5)
WBC: 4.5 10*3/uL (ref 4.0–10.5)
nRBC: 0 % (ref 0.0–0.2)

## 2023-06-19 LAB — LIPASE, BLOOD: Lipase: 23 U/L (ref 11–51)

## 2023-06-19 LAB — HCG, SERUM, QUALITATIVE: Preg, Serum: NEGATIVE

## 2023-06-19 LAB — POCT URINE PREGNANCY: Preg Test, Ur: NEGATIVE

## 2023-06-19 MED ORDER — ONDANSETRON 4 MG PO TBDP: 4.0000 mg | ORAL_TABLET | Freq: Once | ORAL | Status: DC

## 2023-06-19 NOTE — ED Triage Notes (Signed)
Patient here today with c/o abd pain X 4 days but pain has worsened since yesterday. She has tried taking Omeprazole with no relief.

## 2023-06-19 NOTE — ED Triage Notes (Signed)
Pt sent from UC to r/o appendicitis. Pt c/o mid to RLQ abdomen, N/Vx5d.

## 2023-06-19 NOTE — ED Notes (Signed)
Patient is being discharged from the Urgent Care and sent to the Emergency Department via POV . Per Mardella Layman MD, patient is in need of higher level of care due to abdominal pain. Patient is aware and verbalizes understanding of plan of care.  Vitals:   06/19/23 1713  BP: 111/73  Pulse: 64  Resp: 16  Temp: 98.4 F (36.9 C)  SpO2: 99%

## 2023-06-20 ENCOUNTER — Emergency Department (HOSPITAL_COMMUNITY): Payer: Self-pay

## 2023-06-20 LAB — URINALYSIS, ROUTINE W REFLEX MICROSCOPIC
Bacteria, UA: NONE SEEN
Bilirubin Urine: NEGATIVE
Glucose, UA: NEGATIVE mg/dL
Hgb urine dipstick: NEGATIVE
Ketones, ur: 5 mg/dL — AB
Nitrite: NEGATIVE
Protein, ur: NEGATIVE mg/dL
Specific Gravity, Urine: 1.02 (ref 1.005–1.030)
pH: 6 (ref 5.0–8.0)

## 2023-06-20 MED ORDER — ONDANSETRON 4 MG PO TBDP
8.0000 mg | ORAL_TABLET | Freq: Once | ORAL | Status: AC
  Administered 2023-06-20: 8 mg via ORAL
  Filled 2023-06-20: qty 2

## 2023-06-20 MED ORDER — MORPHINE SULFATE (PF) 4 MG/ML IV SOLN
4.0000 mg | Freq: Once | INTRAVENOUS | Status: AC
  Administered 2023-06-20: 4 mg via INTRAVENOUS
  Filled 2023-06-20: qty 1

## 2023-06-20 MED ORDER — SODIUM CHLORIDE 0.9 % IV BOLUS
1000.0000 mL | Freq: Once | INTRAVENOUS | Status: AC
  Administered 2023-06-20: 1000 mL via INTRAVENOUS

## 2023-06-20 MED ORDER — ONDANSETRON HCL 4 MG/2ML IJ SOLN
4.0000 mg | Freq: Once | INTRAMUSCULAR | Status: AC
  Administered 2023-06-20: 4 mg via INTRAVENOUS
  Filled 2023-06-20: qty 2

## 2023-06-20 MED ORDER — ONDANSETRON HCL 4 MG PO TABS: 4.0000 mg | ORAL_TABLET | Freq: Four times a day (QID) | ORAL | 0 refills | Status: AC

## 2023-06-20 MED ORDER — IOHEXOL 350 MG/ML SOLN
75.0000 mL | Freq: Once | INTRAVENOUS | Status: AC | PRN
  Administered 2023-06-20: 75 mL via INTRAVENOUS

## 2023-06-20 NOTE — ED Notes (Signed)
Pt back from CT

## 2023-06-20 NOTE — Discharge Instructions (Addendum)
Evaluation was overall reassuring.  CT scan was negative for any acute process that could be causing her symptoms.  Suspect this is likely viral.  Treatment at this time is supportive.  Recommend rest and hydration.  Also sending Zofran to pharmacy to help with nausea vomiting.  If you have worsening abdominal pain, develop a fever, blood in the stool, blood in the vomit blood in the urine, inability to tolerate oral intake or any other concerning symptom please return emergency department further evaluation.

## 2023-06-20 NOTE — ED Notes (Addendum)
This paramedic is doing the PO fluid challenge with PT, pt is eating ice chips to see if she can hold that down first and then was given water. Pt stated she does not feel nauseas/ like she has to vomit

## 2023-06-20 NOTE — ED Provider Notes (Addendum)
[place] EMERGENCY DEPARTMENT AT [facility] Provider Note   CSN: [id number] Arrival date & time: 06/19/23  1856     History  No chief complaint on file.  HPI Crystal Huff is a 27 y.o. female presenting for abdominal pain and nausea vomiting.  Symptoms all started 5 days ago.  Denies diarrhea states she had a normal bowel movement yesterday.  Pain is primarily lower mid to the right lower quadrant of the abdomen.  Denies urinary symptoms.  Denies flank tenderness.  Denies abnormal vaginal bleeding or discharge.  Denies fever at home.  Was seen by urgent care earlier today and there was concern for appendicitis.  HPI     Home Medications Prior to Admission medications   Medication Sig Start Date End Date Taking? Authorizing Provider  ondansetron (ZOFRAN) 4 MG tablet Take 1 tablet (4 mg total) by mouth every 6 (six) hours. 06/20/23  Yes Gareth Eagle, PA-C      Allergies    Latex    Review of Systems   See HPI for pertinent positives   Physical Exam   Vitals:   06/20/23 0243 06/20/23 0337  BP: 137/86 (!) 101/90  Pulse: 87 65  Resp: 11   Temp: 98 F (36.7 C)   SpO2: 100% 100%    CONSTITUTIONAL:  well-appearing, NAD NEURO:  Alert and oriented x 3, CN 3-12 grossly intact EYES:  eyes equal and reactive ENT/NECK:  Supple, no stridor  CARDIO:  regular rate and rhythm, appears well-perfused  PULM:  No respiratory distress, CTAB GI/GU:  non-distended, soft, non tender MSK/SPINE:  No gross deformities, no edema, moves all extremities  SKIN:  no rash, atraumatic  *Additional and/or pertinent findings included in MDM below   ED Results / Procedures / Treatments   Labs (all labs ordered are listed, but only abnormal results are displayed) Labs Reviewed  COMPREHENSIVE METABOLIC PANEL - Abnormal; Notable for the following components:      Result Value   Potassium 3.4 (*)    All other components within normal limits  URINALYSIS, ROUTINE W REFLEX  MICROSCOPIC - Abnormal; Notable for the following components:   Ketones, ur 5 (*)    Leukocytes,Ua TRACE (*)    All other components within normal limits  LIPASE, BLOOD  CBC  HCG, SERUM, QUALITATIVE    EKG None  Radiology CT ABDOMEN PELVIS W CONTRAST  Result Date: 06/20/2023 CLINICAL DATA:  Right lower quadrant abdominal pain. EXAM: CT ABDOMEN AND PELVIS WITH CONTRAST TECHNIQUE: Multidetector CT imaging of the abdomen and pelvis was performed using the standard protocol following bolus administration of intravenous contrast. RADIATION DOSE REDUCTION: This exam was performed according to the departmental dose-optimization program which includes automated exposure control, adjustment of the mA and/or kV according to patient size and/or use of iterative reconstruction technique. CONTRAST:  75mL OMNIPAQUE IOHEXOL 350 MG/ML SOLN COMPARISON:  None Available. FINDINGS: Lower chest: No acute abnormality. Hepatobiliary: Unremarkable liver. Normal gallbladder. No biliary dilation. Pancreas: Unremarkable. Spleen: Unremarkable. Adrenals/Urinary Tract: Normal adrenal glands. No urinary calculi or hydronephrosis. Bladder is unremarkable. Stomach/Bowel: Normal caliber large and small bowel. No bowel wall thickening. The appendix is normal.Stomach is within normal limits. Vascular/Lymphatic: No significant vascular findings are present. No enlarged abdominal or pelvic lymph nodes. Reproductive: IUD in the uterus.  Unremarkable adnexa. Other: No free intraperitoneal fluid or air. Musculoskeletal: No acute fracture. IMPRESSION: No acute abnormality in the abdomen or pelvis.  Normal appendix. Electronically Signed   By: Minerva Fester  M.D.   On: 06/20/2023 02:55    Procedures Procedures    Medications Ordered in ED Medications  ondansetron (ZOFRAN-ODT) disintegrating tablet 8 mg (has no administration in time range)  sodium chloride 0.9 % bolus 1,000 mL (0 mLs Intravenous Stopped 06/20/23 0305)  ondansetron  (ZOFRAN) injection 4 mg (4 mg Intravenous Given 06/20/23 0120)  morphine (PF) 4 MG/ML injection 4 mg (4 mg Intravenous Given 06/20/23 0116)  iohexol (OMNIPAQUE) 350 MG/ML injection 75 mL (75 mLs Intravenous Contrast Given 06/20/23 0214)    ED Course/ Medical Decision Making/ A&P                                 Medical Decision Making Amount and/or Complexity of Data Reviewed Labs: ordered.  Risk Prescription drug management.   Initial Impression and Ddx 27 year old well-appearing female presenting for abdominal pain, nausea vomiting.  Exam was unremarkable with a non tender abdomen.  DDx includes appendicitis, pyelonephritis, ovarian torsion, ectopic pregnancy, UTI, other. Patient PMH that increases complexity of ED encounter:  none  Interpretation of Diagnostics - I independent reviewed and interpreted the labs as followed: no acute findings  - I independently visualized the following imaging with scope of interpretation limited to determining acute life threatening conditions related to emergency care: CT ab/pel, which revealed no acute findings  Patient Reassessment and Ultimate Disposition/Management Overall workup reassuring.  On reassessment, patient stated symptoms had improved.  No witnessed vomiting during this encounter.  CT scan was unremarkable.  Likely viral process.  Advised to follow-up PCP.  Discussed return precautions.  Vital stable.  Discharged home in condition.  Patient management required discussion with the following services or consulting groups:  None  Complexity of Problems Addressed Acute complicated illness or Injury  Additional Data Reviewed and Analyzed Further history obtained from: Further history from spouse/family member, Past medical history and medications listed in the EMR, and Prior ED visit notes  Patient Encounter Risk Assessment Prescriptions         Final Clinical Impression(s) / ED Diagnoses Final diagnoses:  Abdominal pain,  unspecified abdominal location  Nausea and vomiting, unspecified vomiting type    Rx / DC Orders ED Discharge Orders          Ordered    ondansetron (ZOFRAN) 4 MG tablet  Every 6 hours        06/20/23 0338              Gareth Eagle, PA-C 06/20/23 0339    Gareth Eagle, PA-C 06/20/23 0342    Gilda Crease, MD 06/20/23 [phone]

## 2023-07-12 NOTE — ED Provider Notes (Signed)
[facility] CARE CENTER   [id number] 06/19/23 Arrival Time: 1545  ASSESSMENT & PLAN:  1. Right lower quadrant abdominal pain   Cannot r/o intraabdominal process. To ED for further evaluation. By POV. Stable upon discharge.  Meds ordered this encounter  Medications   DISCONTD: ondansetron (ZOFRAN-ODT) disintegrating tablet 4 mg     Follow-up Information     Go to  [facility] Emergency Department at [facility].   Specialty: Emergency Medicine Contact information: [address] [place] [address] [phone]                Reviewed expectations re: course of current medical issues. Questions answered. Outlined signs and symptoms indicating need for more acute intervention. Patient verbalized understanding. After Visit Summary given.   SUBJECTIVE: History from: patient. Crystal Huff is a 27 y.o. female who presents with complaint of lower R>L abd discomfort; grad onset; x sev days. Normal urination. Denies fever. Denies dysuria. Feels RLQ pain worse now. Mild nausea without emesis. No tx PTA.   Patient's last menstrual period was 03/19/2023 (approximate). Past Surgical History:  Procedure Laterality Date   NO PAST SURGERIES       OBJECTIVE:  Vitals:   06/19/23 1713  BP: 111/73  Pulse: 64  Resp: 16  Temp: 98.4 F (36.9 C)  TempSrc: Oral  SpO2: 99%  Weight: 83.9 kg  Height: 5\' 2"  (1.575 m)    General appearance: alert, oriented, no acute distress; but does look uncomfortable HEENT: ; AT; oropharynx moist Lungs: unlabored respirations Abdomen: soft; without distention; mild  and poorly localized tenderness to palpation over RLQ ; normal bowel sounds; without masses or organomegaly; without guarding or rebound tenderness Back: without reported CVA tenderness; FROM at waist Extremities: without LE edema; symmetrical; without gross deformities Skin: warm and dry Neurologic: normal gait Psychological: alert and  cooperative; normal mood and affect  Labs: Results for orders placed or performed during the hospital encounter of 06/19/23  POC urinalysis dipstick  Result Value Ref Range   Color, UA yellow yellow   Clarity, UA cloudy (A) clear   Glucose, UA negative negative mg/dL   Bilirubin, UA negative negative   Ketones, POC UA negative negative mg/dL   Spec Grav, UA 6.578 4.696 - 1.025   Blood, UA negative negative   pH, UA 7.5 5.0 - 8.0   Protein Ur, POC negative negative mg/dL   Urobilinogen, UA 0.2 0.2 or 1.0 E.U./dL   Nitrite, UA Negative Negative   Leukocytes, UA Small (1+) (A) Negative  POCT urine pregnancy  Result Value Ref Range   Preg Test, Ur Negative Negative   Labs Reviewed  POCT URINALYSIS DIP (MANUAL ENTRY) - Abnormal; Notable for the following components:      Result Value   Clarity, UA cloudy (*)    Leukocytes, UA Small (1+) (*)    All other components within normal limits  POCT URINE PREGNANCY    Imaging: No results found.   Allergies  Allergen Reactions   Latex Other (See Comments)    Causes irritation.                                               Past Medical History:  Diagnosis Date   Medical history non-contributory     Social History   Socioeconomic History   Marital status: Married  Spouse name: Not on file   Number of children: Not on file   Years of education: Not on file   Highest education level: Not on file  Occupational History   Not on file  Tobacco Use   Smoking status: Never   Smokeless tobacco: Never  Vaping Use   Vaping status: Never Used  Substance and Sexual Activity   Alcohol use: No   Drug use: No   Sexual activity: Yes    Birth control/protection: I.U.D.    Comment: desires post partum IUD  Other Topics Concern   Not on file  Social History Narrative   Not on file   Social Determinants of Health   Financial Resource Strain: Not on file  Food Insecurity: Not on file  Transportation Needs: Not on file  Physical  Activity: Not on file  Stress: Not on file  Social Connections: Not on file  Intimate Partner Violence: Not on file    Family History  Problem Relation Age of Onset   Diabetes Maternal Crystal Sellar, MD 07/12/23 [phone]
